# Patient Record
Sex: Female | Born: 1935
Health system: Southern US, Community
[De-identification: ages and names within clinical notes are randomized; demographics above are authoritative.]

## PROBLEM LIST (undated history)

## (undated) DIAGNOSIS — E039 Hypothyroidism, unspecified: Secondary | ICD-10-CM

## (undated) DIAGNOSIS — I2510 Atherosclerotic heart disease of native coronary artery without angina pectoris: Secondary | ICD-10-CM

## (undated) DIAGNOSIS — R519 Headache, unspecified: Secondary | ICD-10-CM

## (undated) DIAGNOSIS — H532 Diplopia: Secondary | ICD-10-CM

## (undated) DIAGNOSIS — R51 Headache: Secondary | ICD-10-CM

## (undated) DIAGNOSIS — R413 Other amnesia: Secondary | ICD-10-CM

## (undated) DIAGNOSIS — G43909 Migraine, unspecified, not intractable, without status migrainosus: Secondary | ICD-10-CM

## (undated) DIAGNOSIS — F329 Major depressive disorder, single episode, unspecified: Secondary | ICD-10-CM

## (undated) DIAGNOSIS — F32A Depression, unspecified: Secondary | ICD-10-CM

## (undated) DIAGNOSIS — Z9289 Personal history of other medical treatment: Secondary | ICD-10-CM

## (undated) DIAGNOSIS — M1990 Unspecified osteoarthritis, unspecified site: Secondary | ICD-10-CM

## (undated) DIAGNOSIS — T4145XA Adverse effect of unspecified anesthetic, initial encounter: Secondary | ICD-10-CM

## (undated) DIAGNOSIS — N289 Disorder of kidney and ureter, unspecified: Secondary | ICD-10-CM

## (undated) DIAGNOSIS — I10 Essential (primary) hypertension: Secondary | ICD-10-CM

## (undated) DIAGNOSIS — I722 Aneurysm of renal artery: Secondary | ICD-10-CM

## (undated) DIAGNOSIS — R269 Unspecified abnormalities of gait and mobility: Secondary | ICD-10-CM

## (undated) DIAGNOSIS — K219 Gastro-esophageal reflux disease without esophagitis: Secondary | ICD-10-CM

## (undated) DIAGNOSIS — G629 Polyneuropathy, unspecified: Secondary | ICD-10-CM

## (undated) DIAGNOSIS — I219 Acute myocardial infarction, unspecified: Secondary | ICD-10-CM

## (undated) DIAGNOSIS — K668 Other specified disorders of peritoneum: Secondary | ICD-10-CM

## (undated) DIAGNOSIS — F411 Generalized anxiety disorder: Secondary | ICD-10-CM

## (undated) HISTORY — DX: Migraine, unspecified, not intractable, without status migrainosus: G43.909

## (undated) HISTORY — PX: CHOLECYSTECTOMY: SHX55

## (undated) HISTORY — DX: Other specified disorders of peritoneum: K66.8

## (undated) HISTORY — PX: BACK SURGERY: SHX140

## (undated) HISTORY — DX: Diplopia: H53.2

## (undated) HISTORY — PX: ABDOMINAL HYSTERECTOMY: SHX81

## (undated) HISTORY — DX: Unspecified abnormalities of gait and mobility: R26.9

## (undated) HISTORY — DX: Disorder of kidney and ureter, unspecified: N28.9

## (undated) HISTORY — PX: TONSILLECTOMY: SUR1361

## (undated) HISTORY — DX: Acute myocardial infarction, unspecified: I21.9

## (undated) HISTORY — PX: COLON SURGERY: SHX602

## (undated) HISTORY — PX: CARDIAC CATHETERIZATION: SHX172

## (undated) HISTORY — PX: APPENDECTOMY: SHX54

## (undated) HISTORY — DX: Other amnesia: R41.3

## (undated) HISTORY — PX: HAND SURGERY: SHX662

## (undated) HISTORY — DX: Generalized anxiety disorder: F41.1

## (undated) HISTORY — DX: Aneurysm of renal artery: I72.2

## (undated) HISTORY — DX: Hypercalcemia: E83.52

---

## 1958-10-04 DIAGNOSIS — I219 Acute myocardial infarction, unspecified: Secondary | ICD-10-CM

## 1958-10-04 HISTORY — DX: Acute myocardial infarction, unspecified: I21.9

## 1978-10-04 DIAGNOSIS — T8859XA Other complications of anesthesia, initial encounter: Secondary | ICD-10-CM

## 1978-10-04 HISTORY — DX: Other complications of anesthesia, initial encounter: T88.59XA

## 1999-04-17 ENCOUNTER — Ambulatory Visit (HOSPITAL_COMMUNITY): Admission: RE | Admit: 1999-04-17 | Discharge: 1999-04-17 | Payer: Self-pay | Admitting: Family Medicine

## 1999-04-17 ENCOUNTER — Encounter: Payer: Self-pay | Admitting: Family Medicine

## 1999-07-14 ENCOUNTER — Ambulatory Visit (HOSPITAL_COMMUNITY): Admission: RE | Admit: 1999-07-14 | Discharge: 1999-07-14 | Payer: Self-pay | Admitting: Orthopaedic Surgery

## 1999-07-28 ENCOUNTER — Ambulatory Visit (HOSPITAL_COMMUNITY): Admission: RE | Admit: 1999-07-28 | Discharge: 1999-07-28 | Payer: Self-pay | Admitting: Orthopaedic Surgery

## 1999-09-11 ENCOUNTER — Ambulatory Visit (HOSPITAL_COMMUNITY): Admission: RE | Admit: 1999-09-11 | Discharge: 1999-09-11 | Payer: Self-pay | Admitting: *Deleted

## 2001-02-07 ENCOUNTER — Inpatient Hospital Stay (HOSPITAL_COMMUNITY): Admission: RE | Admit: 2001-02-07 | Discharge: 2001-02-08 | Payer: Self-pay | Admitting: Orthopaedic Surgery

## 2007-01-25 ENCOUNTER — Ambulatory Visit: Payer: Self-pay | Admitting: Internal Medicine

## 2007-01-25 LAB — CONVERTED CEMR LAB: Creatinine, Ser: 0.9 mg/dL (ref 0.4–1.2)

## 2007-01-28 ENCOUNTER — Encounter: Admission: RE | Admit: 2007-01-28 | Discharge: 2007-01-28 | Payer: Self-pay | Admitting: Internal Medicine

## 2007-02-02 DIAGNOSIS — M1990 Unspecified osteoarthritis, unspecified site: Secondary | ICD-10-CM | POA: Insufficient documentation

## 2007-02-02 DIAGNOSIS — I10 Essential (primary) hypertension: Secondary | ICD-10-CM | POA: Insufficient documentation

## 2007-02-02 DIAGNOSIS — K5730 Diverticulosis of large intestine without perforation or abscess without bleeding: Secondary | ICD-10-CM | POA: Insufficient documentation

## 2007-02-02 DIAGNOSIS — K668 Other specified disorders of peritoneum: Secondary | ICD-10-CM | POA: Insufficient documentation

## 2007-02-02 DIAGNOSIS — F411 Generalized anxiety disorder: Secondary | ICD-10-CM | POA: Insufficient documentation

## 2007-02-02 DIAGNOSIS — F329 Major depressive disorder, single episode, unspecified: Secondary | ICD-10-CM

## 2007-02-02 DIAGNOSIS — K5909 Other constipation: Secondary | ICD-10-CM | POA: Insufficient documentation

## 2007-02-02 DIAGNOSIS — R51 Headache: Secondary | ICD-10-CM

## 2007-02-02 DIAGNOSIS — K559 Vascular disorder of intestine, unspecified: Secondary | ICD-10-CM | POA: Insufficient documentation

## 2007-02-28 ENCOUNTER — Ambulatory Visit (HOSPITAL_COMMUNITY): Admission: RE | Admit: 2007-02-28 | Discharge: 2007-02-28 | Payer: Self-pay | Admitting: Internal Medicine

## 2007-03-15 ENCOUNTER — Ambulatory Visit: Payer: Self-pay | Admitting: Internal Medicine

## 2007-05-25 ENCOUNTER — Encounter: Admission: RE | Admit: 2007-05-25 | Discharge: 2007-05-25 | Payer: Self-pay | Admitting: Cardiothoracic Surgery

## 2007-05-26 ENCOUNTER — Ambulatory Visit: Payer: Self-pay | Admitting: Cardiothoracic Surgery

## 2007-06-30 ENCOUNTER — Encounter: Payer: Self-pay | Admitting: Internal Medicine

## 2010-06-17 NOTE — Assessment & Plan Note (Signed)
[facility]                         GASTROENTEROLOGY OFFICE NOTE   CESIAH, WESTLEY                       MRN:          [id number]  DATE:01/25/2007                            DOB:          [birth year]    REFERRING PHYSICIAN:  Ramond Craver, DO   REASON FOR CONSULTATION:  Second opinion regarding recurrent  pneumoperitoneum.   ASSESSMENT:  This is a 77 year old white woman that had right-sided  ischemic colitis treated with surgical resection in August of 2008.  Subsequently she has had 3 episodes of pneumoperitoneum.  Etiology has  been unclear.  She presents with abdominal pain and infrascapular pain,  and has been found to have pneumoperitoneum.  She has an exploratory  laparotomy in October that was unrevealing.   The etiology of this is not clear to me.  I need to obtain other  records.  Based upon the records I have, there does not seem to be any  small bowel diverticulosis, nor is there evidence of other  diverticulosis in the colon.  Question if she is having some recurrent  ischemia causing this.  She is tender in the right lower quadrant today,  whether that is just merely a postoperative phenomenon or something else  is not clear to me at this time.   PLAN:  1. Obtain records from [facility] admission earlier this year.  2. Obtain records of previous colonoscopy last year, performed in      [place].  3. Perform CT of the abdomen and pelvis with IV contrast.  This will      give [place] some look at the large vessels that supply the gut as well      as the other intra-abdominal and pelvic organ, which could be      related to a problem.   HISTORY:  As above, this 77 year old white woman was in her usual state  of health until August of this year when she presented to the hospital  in [place] and was found to have ischemic colitis of the right colon.  She underwent a right hemicolectomy.  That was an acute onset.  She was  then  transferred to [facility], where she recuperated.  She tells me  she had an upper GI endoscopy there.  I do not have a lot of details  otherwise on that.  She recovered.  Subsequently she developed recurrent  pneumoperitoneum in September, October, and November.  In October she  had exploratory laparotomy by Dr. Hettie Holstein. Her bowel was run.  There  was some lysis of adhesion, adhesions, but there was no evidence of a  perforated viscus.  In between she has felt well, though she has had  some problems with abdominal pain but nothing like the severe pain in  the abdomen and in the shoulders.  GI history notable for some  distension, chronic constipation.  The colonoscopy a year ago was  reportedly normal, or at least she does not really know the results of  that, but said she was told to have one in a year.  X-rays when she was  admitted in October show ileus versus partial small bowel obstruction.  She has had some mild right upper quadrant and lower quadrant dull  aching pain for the past 2-3 days, but says that is not like what  happens when she starts with the spontaneous pneumoperitoneum where  there seems to be a relatively sudden onset of pain.  She has tolerated  these episodes remarkably well.  Laboratory studies have been  unrevealing as well.   PAST MEDICAL HISTORY:  1. Hypertension.  2. Depression.  3. The ischemic colitis status post resection.  4. Total abdominal hysterectomy.  5. Cholecystectomy.  6. Appendectomy.  7. Prior Nissen fundoplication in 1998 by Glenna Fellows.  8. Exploratory laparotomy in October as described.  9. Chronic headaches.  10.Osteoarthritis.  11.Anxiety.   MEDICATIONS:  1. Lisinopril 10 mg 1-1/2 tabs daily.  2. Hydrochlorothiazide 12.5 mg every morning.  3. Black cohosh root2 each day.  4. Aspirin 81 mg daily.  5. Omeprazole 20 mg every evening.  6. Metoprolol 50 mg daily.  7. Trazodone 50 mg at bedtime.   ALLERGIES:  No known  allergies.   ADDITIONAL HISTORY:  This dull, aching pain in the right side occurs.  She has had more severe pain when she had the episode of  pneumoperitoneum and it was worse when lay on her right side.  Her bowel  habits are otherwise unchanged.  GI review of systems is otherwise  negative.   FAMILY HISTORY:  Breast cancer in the sister, diabetes in the father,  heart disease in her parents, she believes.  Crohn's disease in a few  second cousins.   SOCIAL HISTORY:  She is married.  She is retired from the [employer] system.  She lives with her husband.  She has 1 son, 2 daughters.  No alcohol, tobacco, or drugs.   REVIEW OF SYSTEMS:  She said she was febrile when she was hospitalized.  She had some insomnia.  She has had some fatigue problems after the  initial bout of ischemic colitis.  She wears eyeglasses.  All other  systems are negative.   PHYSICAL EXAMINATION:  Reveals a relatively robust-appearing elderly,  white woman.  Height 5 feet 7 inches.  Weight 180.8 pounds.  Blood  pressure 104/64.  Pulse 68 and regular.  EYES:  Anicteric.  ENT:  Shows normal mouth, posterior pharynx.  NECK:  Supple. No thyromegaly.  CHEST:  Clear.  HEART:  S1, S2.  No rubs, murmurs, or gallops.  ABDOMEN:  Obese, soft.  She is mildly tender in the right lower  quadrant.  There are surgical scars present.  There is no hernia.  LYMPHATIC:  No neck or supraclavicular adenopathy or inguinal adenopathy  detected.  EXTREMITIES:  Free of edema in the lower extremity.  SKIN:  Warm and dry, without acute rash.  NEUROLOGIC:  Grossly intact.  PSYCHIATRIC:  She is alert and oriented x3.   DATA REVIEWED:  History and physical, discharge summaries, operative  notes from [facility] from August, October, and November of 2008.  Pathology report October 01, 2006 showing ascending colon segment, and  distal ileum consistent with ischemic colitis.  Abdominal films noted.  These are from  October, showing the ileus patterns.   Will request the [facility] records as well as the records for the colonoscopy  as described above.   I appreciate the opportunity to care for this patient.  This is a  complicated situation.  She may need further studies  other than that  mentioned above, but would start with the CT scan.  Further plans  pending the above.   I appreciate the opportunity to care for this patient.     Iva Boop, MD,FACG  Electronically Signed    CEG/MedQ  DD: 01/25/2007  DT: 01/26/2007  Job #: [id number]   cc:   Ramond Craver, DO

## 2010-06-17 NOTE — Consult Note (Signed)
NEW PATIENT CONSULTATION   Laurie Atkinson, Laurie Atkinson  DOB:  1933/10/24                                        May 26, 2007  CHART #:  [id number]   PRIMARY CARE PHYSICIAN:  Dr. Lois Huxley, [place], [place].   REASON FOR CONSULTATION:  Third opinion consultation for recurrent  pneumoperitoneum after surgical resection of ischemic colitis.   HISTORY OF PRESENT ILLNESS:  The patient is a 77 year old female with a  complicated medical history with various procedures done in multiple  different institutions in the past.  In September 04, 2006, the patient  developed acute abdominal pain, underwent colon resection at [facility] by Dr. Marvetta Gibbons and then was transferred to [facility] for  postoperative care.  She again then represented in October 2008 with  pneumoperitoneum, underwent exploratory laparotomy in [place], [place]  [place], with no source of perforation found and was discharged home.  She presented again in November 2008 with recurrent pneumoperitoneum and  was not reoperated on, but observed.  Subsequently, she was referred to  Dr. Leone Payor in [place] office.  The patient tells me today she is here  to have her lungs checked.Marland Kitchen   PAST MEDICAL HISTORY:  Significant for  1. Hypertension.  2. Ischemic bowels noted above August 2008  3. History of depression.   PAST SURGICAL HISTORY:  1. Exploratory laparotomy November 28, 2006.  2. Exploratory laparotomy with hemicolectomy August 2008 for ischemic      bowel.  3. Total abdominal hysterectomy.  4. Cholecystectomy.  5. Appendectomy.  6. Laparoscopic Nissen fundoplication in 1998 by Dr. Johna Sheriff in      [place], [place], for reflux esophagitis.   SOCIAL HISTORY:  The patient is a nonsmoker and nondrinker.  Currently  retired, lives with her husband.   FAMILY HISTORY:  Significant for breast cancer in his sister myocardial  infarction in her mother, hypertension and stroke in her  father.   REVIEW OF SYSTEMS:  Other than noted above, patient has had no change in  bowel habits.  No blood in her stool.  She does get bloating after  eating which has been chronic.  She has had no hemoptysis.  No  hematuria.  Has no anginal chest pain.  Other review of systems are  negative.   PHYSICAL EXAMINATION:  VITAL SIGNS:  Her blood pressure is 180/79, pulse  is 61, respiratory rate 18, O2 sats 97%.  NECK:  The patient has no carotid bruits.  I do not appreciate any neck  masses.  LUNGS:  Clear bilaterally.  CARDIAC:  Exam reveals regular rate and rhythm without murmur or gallop.  ABDOMEN:  Exam is benign.  She does have a healed midline abdominal  incision consistent with her history of resection. LOWER EXTREMITIES:  Without pedal edema or tenderness.   Prior to my seeing the patient, Dr. Marvetta Gibbons had discussed the case  with my partner, Dr. Cornelius Moras, who had recommended barium swallow and CT  scan.  The barium swallow is reviewed and x-ray interpretations show  moderate suspicion for a mass in the oral pharynx near the vallecula.  Direct visualization was recommended.  Status post hiatal hernia repair.  No evidence of reflux or stricture.  The CT scan showed no evidence of  pneumothorax or pneumoperitoneum, mild cardiomegaly, shotty mediastinal  lymph nodes presumably  reactive were noted.   SUGGESTIONS:  Currently, there is no obvious source of pneumoperitoneum  with a nonsmoker with no lung lesions that would be suspicious for  causing a pneumothorax/pneumoperitoneum.  It is unlikely the chest is  the source of her recurrent pneumoperitoneum.  It is concerning  that  radiographic findings of the larynx and, although this may be an over-  read by radiology, I have discussed the case with the patient's primary  care doctor, Dr. Katrinka Blazing in [place], [facility], who arranged for  an ENT evaluation locally.  Since the patient has been operated on by  Dr.  Johna Sheriff, I have  asked her to make an appointment with him, not for any  specific treatment this time, but should she have recurrent  pneumoperitoneum, he will have previously evaluated her.   Sheliah Plane, MD  Electronically Signed   EG/MEDQ  D:  05/26/2007  T:  05/26/2007  Job:  [id number]   cc:   Lois Huxley, M.D.  Salvatore Decent. Cornelius Moras, M.D.  Lorne Skeens. Hoxworth, M.D.

## 2010-06-17 NOTE — Assessment & Plan Note (Signed)
[facility]                         GASTROENTEROLOGY OFFICE NOTE   JAWANNA, DYKMAN                       MRN:          [id number]  DATE:03/15/2007                            DOB:          1933-06-24    Ms. Baltazar returns.  I had seen her through a second-opinion consultation  from Dr. Hettie Holstein in [facility].  Please see my note of January 25, 2007.  Briefly, she had three episodes of pneumoperitoneum after an  initial surgical resection for right-sided ischemic colitis.  Since  December, she has done well, except for some mild aching pain in the  right abdominal area.  Workup I did included review of the records from  [facility].  She had some diffuse calcifications on a CT angio, but no  focal artery stenosis.  She had a CT of the abdomen and pelvis here, in  [place], which showed postoperative changes, hypodensity in the  spleen, renal hypodensities, likely cysts.  She had some end plate  compression of T12 of indeterminate age, sigmoid diverticulosis and a 3  cm cystic structure in the region of the uterus.  An ultrasound was  performed and showed this to be fluid-filled and have benign features  and indeed she has had a total abdominal hysterectomy and bilateral  salpingo-oophorectomy, so it is interpreted to be a postoperative fluid  collection.   I explained all of this to her today.   Her medications are listed and reviewed in the chart.   She had a colonoscopy in 2006 by Dr. Volney Presser and it did not show any  polyps at the time.  She had hemorrhoids and diverticulosis.  So she  should not need a colonoscopy before five years.  (She says there is a  history of polyps.)   I will plan to see her back as needed at this point.  I do not know why  she had the recurrent pneumoperitoneum.  I cannot see any other workup  at this time, based on what I know here.  She will follow up as needed,  but she plans to get her regular followup in  [facility] [address]     Iva Boop, MD,FACG  Electronically Signed    CEG/MedQ  DD: 03/15/2007  DT: 03/16/2007  Job #: [id number]   cc:   Ramond Craver, DO, [place]

## 2010-06-20 NOTE — Op Note (Signed)
[place]  Patient:    Laurie Atkinson, Laurie Atkinson Visit Number: [id number] MRN: [id number]          Service Type: SUR Location: [facility] 2550 06 Attending Physician:  Jacki Cones Dictated by:   Veverly Fells Ophelia Charter, M.D. Proc. Date: 02/07/01 Admit Date:  02/07/2001                             Operative Report  PREOPERATIVE DIAGNOSIS:  L4-5 foraminal stenosis.  POSTOPERATIVE DIAGNOSIS:  L4-5 foraminal stenosis.  PROCEDURE:  L4-5 decompression with bilateral foraminotomies.  SURGEON:  Mark C. Ophelia Charter, M.D.  ASSISTANT:  Zonia Kief, P.A.-C.  ESTIMATED BLOOD LOSS:  100 ml.  COMPLICATIONS:  None.  DESCRIPTION OF PROCEDURE:  After induction of general anesthesia, patient placed in the Holcomb frame with careful padding and positioning. Preoperative Ancef was given.  The back was prepped with Duraprep.  The area was square with towels, a Betadine Vi-Drape applied, a laminectomy sheet applied.  A midline incision was made after needle localization with two needles showed that the inferior needle was at L4-5 and the superior needle was adjacent to the L3-4 space.  Incision was made starting at the inferior needle and extending distally for 4 cm and 1 cm proximal.  Subperiosteal dissection was performed on the L4 lamina, and self-retaining retractor was placed.  A laminotomy was performed after thinning the lamina with the Leksell bilaterally.  There was hypertrophic ligament, which was removed.  Once the dura was visualized, operative microscope was brought in and the remaining ligament was removed.  Bone was removed out to the L4 pedicle.  The foramina were tight, and the top portion of the L5 lamina was removed, enlarging the foramina and taking a portion of the superior and inferior facets that was causing compression on the nerve roots.  Both nerve roots were directly visualized with the axilla of the nerve root and the first centimeter of the nerve root  completely visualized.  There was epidural fat in both axillae, and once bone had been removed out to the pedicle, the lateral wall was debrided in line with the pedicle superiorly.  Then a portion of the lamina was left up at the top, since it was not an area of central compression.  There was direct visualization of the dura, and the disk was checked from both sides as well as palpated in the midline.  The disk was hard and although previous myelo-CT had shown some questionable disk material, this was due to some end plate spurring in the hard disk, and there was no extruded material.  After irrigation with saline solution, nerve roots were checked again and palpated cephalad, caudad, anterior, and dorsal to the nerve root, and there was complete freedom.  After irrigation with saline solution, fascia was closed with 0 Vicryl, subcutaneous tissue with 2-0 Vicryl, and Marcaine infiltration of the skin and skin staple closure.  Postop, the patient had good relief of her leg pain and was able to lie on her right side for the first time in a year without recurrence of her leg pain from nerve root compression. Dictated by:   Veverly Fells Ophelia Charter, M.D. Attending Physician:  Jacki Cones DD:  02/07/01 TD:  02/07/01 Job: [id number]

## 2012-02-03 DIAGNOSIS — Z9289 Personal history of other medical treatment: Secondary | ICD-10-CM

## 2012-02-03 HISTORY — DX: Personal history of other medical treatment: Z92.89

## 2012-04-02 HISTORY — PX: HERNIA REPAIR: SHX51

## 2013-02-22 ENCOUNTER — Encounter: Payer: Self-pay | Admitting: Surgery

## 2013-03-07 ENCOUNTER — Ambulatory Visit: Payer: 59 | Admitting: Endocrinology

## 2013-03-16 ENCOUNTER — Other Ambulatory Visit (INDEPENDENT_AMBULATORY_CARE_PROVIDER_SITE_OTHER): Payer: 59

## 2013-03-16 ENCOUNTER — Encounter: Payer: Self-pay | Admitting: Endocrinology

## 2013-03-16 ENCOUNTER — Ambulatory Visit (INDEPENDENT_AMBULATORY_CARE_PROVIDER_SITE_OTHER): Payer: 59 | Admitting: Endocrinology

## 2013-03-16 DIAGNOSIS — I10 Essential (primary) hypertension: Secondary | ICD-10-CM

## 2013-03-16 LAB — BASIC METABOLIC PANEL
BUN: 19 mg/dL (ref 6–23)
CALCIUM: 10.5 mg/dL (ref 8.4–10.5)
CO2: 28 meq/L (ref 19–32)
CREATININE: 1.4 mg/dL — AB (ref 0.4–1.2)
Chloride: 97 mEq/L (ref 96–112)
GFR: 40.37 mL/min — ABNORMAL LOW (ref >60.00)
Glucose, Bld: 88 mg/dL (ref 70–99)
Potassium: 3.7 mEq/L (ref 3.5–5.1)
Sodium: 133 mEq/L — ABNORMAL LOW (ref 135–145)

## 2013-03-16 NOTE — Progress Notes (Addendum)
Patient ID: Laurie Atkinson, female   DOB: [birth year], 80 y.o.   MRN: [id number]   Chief complaint: High calcium  History of Present Illness:   Review of records show that she has had a high calcium since 01/2013 but previously records are not available  The hypercalcemia is not associated with any pathologic fractures, sarcoidosis, known carcinoma, thyroid disease She does have mild renal insufficiency. She thinks she has had a kidney stone seen on x-ray 4-5 years ago but has not passed a stone or had any acute pain She has not had any height loss or known vertebral fractures   Calcium levels in the last 2 months have been ranging from 10.2-11.6 the last level being on 02/20/13 Parathyroid hormone level not available and no recent  25 (OH) Vitamin D level  available She has been on vitamin D supplements for about 2 years, taking 50,000 units weekly, presumably for vitamin D deficiency     Medication List       This list is accurate as of: 03/16/13  2:37 PM.  Always use your most recent med list.               diazepam 5 MG tablet  Commonly known as:  VALIUM  Take 5 mg by mouth daily as needed for anxiety.     FLUZONE HIGH-DOSE injection  Generic drug:  influenza vac split trivalent high-dose     gabapentin 300 MG capsule  Commonly known as:  NEURONTIN     HYDROcodone-acetaminophen 5-325 MG per tablet  Commonly known as:  NORCO/VICODIN  Take 1 tablet by mouth every 6 (six) hours as needed for moderate pain.     lisinopril-hydrochlorothiazide 20-12.5 MG per tablet  Commonly known as:  PRINZIDE,ZESTORETIC  Takes 2 tablets in am     metoprolol succinate 50 MG 24 hr tablet  Commonly known as:  TOPROL-XL     pantoprazole 40 MG tablet  Commonly known as:  PROTONIX     PROBIOTIC DAILY PO  Take by mouth.     Vitamin D (Ergocalciferol) 50000 UNITS Caps capsule  Commonly known as:  DRISDOL        Allergies:  Allergies  Allergen Reactions  . Morphine And Related Hives   . Oxycodone Itching  . Ambien [Zolpidem] Anxiety    Past Medical History  Diagnosis Date  . Aneurysm of renal artery   . Unspecified disorder of kidney and ureter   . Hypercalcemia   . Abnormality of gait   . Diplopia   . Other specified disorder of peritoneum   . Anxiety state, unspecified     No past surgical history on file.  No family history on file.  Social History:  reports that she has never smoked. She has never used smokeless tobacco. Her alcohol and drug histories are not on file.    REVIEW of SYSTEMS  She did lose weight after her surgery last year but not recently  Recently has had some decreased appetite but this is variable  She complains of feeling weak and tired for some time  Htn years, 1/15 takes 2   Has history of anemia  Loose stools at times but no abdominal pain or nausea  No recent swelling of her feet  No history of recent low back pain or bone/ joint pains  EXAM:  BP 128/64  Pulse 62  Temp(Src) 98.3 F (36.8 C)  Resp 16  Ht 5\' 7"  (1.702 m)  Wt 183 lb 3.2 oz (  83.099 kg)  BMI 28.69 kg/m2  SpO2 95%  GENERAL: Averagely built, heavy set  No pallor, clubbing, lymphadenopathy in the neck or peripheral edema.   Skin:  no rash or pigmentation.  EYES:  Externally normal.  Fundii:  normal discs and vessels.  ENT: Oral mucosa and tongue normal.  THYROID:  Not palpable. No other mass in the neck  HEART:  Normal  S1 and S2; no murmur or click.  CHEST:  Normal shape Lungs:   Vescicular breath sounds heard equally.  No crepitations/ wheeze.  ABDOMEN:  No distention.  Liver and spleen not palpable.  No other mass there is some tenderness in the right lower quadrant present  NEUROLOGICAL: .Reflexes are normal bilaterally at biceps and 1+ at the ankles  SPINE AND JOINTS:  Normal.  Assessment/Plan:   HYPERCALCEMIA: Etiology is unclear but most likely related to either primary or secondary hyperparathyroidism. She is also on 25 mg  HCTZ which would potentially exacerbate the hypercalcemia Currently she appears asymptomatic and has had no history of known osteoporosis, fragility fractures or symptomatic nephrolithiasis Discussed with the patient at if she does have hyperparathyroidism that she is not a candidate for surgery because of not meeting the criteria and the calcium level being less than 11.5  Plan:  She will be evaluated further with parathyroid hormone levels, vitamin D levels, serum protein electrophoresis.  Renal function and calcium will be rechecked since her HCTZ and lisinopril were increased last month  Would recommend that she get a bone density for baseline evaluation to her PCP  Also would recommend that she be treated with a drug other than HCTZ for hypertension  Need periodic monitoring if she only has hyperparathyroidism  Consider adjusting vitamin D dosage based on vitamin D levels   Laurie Atkinson 03/16/2013, 2:37 PM   Addendum:   Vitamin D 40.8,  normal, repeat calcium 10.5, needs PTH drawn as plasma not drawn at the lab

## 2013-03-17 LAB — PARATHYROID HORMONE, INTACT (NO CA)

## 2013-03-17 LAB — VITAMIN D 25 HYDROXY (VIT D DEFICIENCY, FRACTURES): Vit D, 25-Hydroxy: 40.8 ng/mL (ref 30.0–100.0)

## 2013-03-27 ENCOUNTER — Encounter: Payer: Self-pay | Admitting: Surgery

## 2013-03-31 ENCOUNTER — Encounter: Payer: Self-pay | Admitting: Surgery

## 2013-04-02 DIAGNOSIS — I722 Aneurysm of renal artery: Secondary | ICD-10-CM

## 2013-04-02 HISTORY — DX: Aneurysm of renal artery: I72.2

## 2013-04-03 ENCOUNTER — Encounter: Payer: Self-pay | Admitting: Surgery

## 2013-04-03 ENCOUNTER — Ambulatory Visit (INDEPENDENT_AMBULATORY_CARE_PROVIDER_SITE_OTHER): Payer: 59 | Admitting: Surgery

## 2013-04-03 VITALS — BP 132/59 | HR 58 | Resp 16 | Ht 67.0 in | Wt 189.0 lb

## 2013-04-03 DIAGNOSIS — I722 Aneurysm of renal artery: Secondary | ICD-10-CM

## 2013-04-03 NOTE — Progress Notes (Signed)
Patient name: Laurie NakayamaBillie F Atkinson MRN: [id number] DOB: September 08, 1933 Sex: female   Referred by: Dr. Earlene Plateravis  Reason for referral:  Chief Complaint  Patient presents with  . New Evaluation    Right renal artery aneurysm, Ref. by Dr. Lois HuxleyJames Mullinix  C/O Pain in right lower back to abdomin, duration 5 mo.    HISTORY OF PRESENT ILLNESS: This is a very pleasant 80 year old female who was referred today for evaluation of a right renal artery aneurysm.  She states that she just found out about this in the center.  She comes with imaging studies [facility] does show a 1.5 cm right renal artery aneurysm on a February 2014 scan.  She had a CAT scan and [place] which showed a stable aneurysm.  The patient denies having abdominal discomfort.  She does have a history of free air in the past.  She has had multiple episodes of free air and undergone exploration once which was negative.  She is also had a cholecystectomy and appendectomy.  The patient suffers with hypertension which is controlled medically.  She has hypercholesterolemia which is diet controlled.  She has been told she had a silent heart attack in the past.  Past Medical History  Diagnosis Date  . Aneurysm of renal artery   . Unspecified disorder of kidney and ureter   . Hypercalcemia   . Abnormality of gait   . Diplopia   . Other specified disorder of peritoneum   . Anxiety state, unspecified   . Renal artery aneurysm March 2015  . Myocardial infarction     ? when    Past Surgical History  Procedure Laterality Date  . Hernia repair  04/2012    Umbilical  . Abdominal hysterectomy    . Appendectomy    . Colon surgery      History   Social History  . Marital Status: Married    Spouse Name: N/A    Number of Children: N/A  . Years of Education: N/A   Occupational History  . Not on file.   Social History Main Topics  . Smoking status: Never Smoker   . Smokeless tobacco: Never Used  . Alcohol Use: No    . Drug Use: No  . Sexual Activity: Not on file   Other Topics Concern  . Not on file   Social History Narrative  . No narrative on file    Family History  Problem Relation Age of Onset  . Heart disease Mother     Heart Disease before age 78  . Stroke Mother     [id number]  . Cancer Father     Throat  . Diabetes Father   . Cancer Sister   . Heart disease Brother     Pacemaker    Allergies as of 04/03/2013 - Review Complete 04/03/2013  Allergen Reaction Noted  . Morphine and related Hives 03/16/2013  . Oxycodone Itching 03/16/2013  . Ambien [zolpidem] Anxiety 03/16/2013    Current Outpatient Prescriptions on File Prior to Visit  Medication Sig Dispense Refill  . diazepam (VALIUM) 5 MG tablet Take 5 mg by mouth daily as needed for anxiety.      Marland Kitchen. FLUZONE HIGH-DOSE injection       . gabapentin (NEURONTIN) 300 MG capsule       . HYDROcodone-acetaminophen (NORCO/VICODIN) 5-325 MG per tablet Take 1 tablet by mouth every 6 (six) hours as needed for moderate pain.      .Marland Kitchen  lisinopril-hydrochlorothiazide (PRINZIDE,ZESTORETIC) 20-12.5 MG per tablet Takes 2 tablets in am      . metoprolol succinate (TOPROL-XL) 50 MG 24 hr tablet       . pantoprazole (PROTONIX) 40 MG tablet       . Probiotic Product (PROBIOTIC DAILY PO) Take by mouth.      . Vitamin D, Ergocalciferol, (DRISDOL) 50000 UNITS CAPS capsule        No current facility-administered medications on file prior to visit.     REVIEW OF SYSTEMS: Cardiovascular: No chest pain, chest pressure, palpitations, orthopnea, or dyspnea on exertion. No claudication or rest pain.  Positive for swelling in her legs and varicose veins Pulmonary: No productive cough, asthma or wheezing. Neurologic: No weakness, paresthesias, aphasia, or amaurosis. No dizziness. Hematologic: No bleeding problems or clotting disorders. Musculoskeletal: No joint pain or joint swelling. Gastrointestinal: No blood in stool or hematemesis Genitourinary: Positive  for burning with urination Psychiatric:: No history of major depression. Integumentary: No rashes or ulcers. Constitutional: No fever or chills.  PHYSICAL EXAMINATION: General: The patient appears their stated age.  Vital signs are BP 132/59  Pulse 58  Resp 16  Ht 5\' 7"  (1.702 m)  Wt 189 lb (85.73 kg)  BMI 29.59 kg/m2  SpO2 100% HEENT:  No gross abnormalities Pulmonary: Respirations are non-labored Abdomen: Soft and non-tender.  No abdominal bruits.  Musculoskeletal: There are no major deformities.   Neurologic: No focal weakness or paresthesias are detected, Skin: There are no ulcer or rashes noted. Psychiatric: The patient has normal affect. Cardiovascular: There is a regular rate and rhythm without significant murmur appreciated.  Palpable dorsalis pedis pulse bilaterally.  No carotid bruits.  Diagnostic Studies: I have reviewed the images I have available to me.  I have seen a CAT scan from February 2014 which shows a 1.5 cm aneurysm.  I have also reviewed a CAT scan from 2012 which shows right renal artery ectasia but aneurysm is not well visualized.  I cannot do her most recent study from [place]    Assessment:  Right renal artery aneurysm Plan: I discussed with the patient and her family that from what I can determine this aneurysm has been relatively stable over time.  Maximum diameter is 1.5 cm.  At this diameter I would not recommend repair as I think she is at very low risk for rupture.  All the CAT scans I have reviewed are not timed to evaluate the arteries, so there is venous artifact.  In addition they are 5 mm cut was does not give me the detail I like to see.  Therefore, I have recommended that the patient come back for a repeat evaluation in 6 months with a formal CT angiogram of the abdomen and pelvis.   Jorge Ny, M.D. [facility] Office: [phone] Pager:  [phone]

## 2013-04-04 NOTE — Addendum Note (Signed)
Addended by: Sharee PimpleMCCHESNEY, MARILYN K on: 04/04/2013 08:07 AM   Modules accepted: Orders

## 2013-05-15 ENCOUNTER — Encounter: Payer: Self-pay | Admitting: Endocrinology

## 2013-05-15 ENCOUNTER — Other Ambulatory Visit (INDEPENDENT_AMBULATORY_CARE_PROVIDER_SITE_OTHER): Payer: 59

## 2013-05-15 ENCOUNTER — Ambulatory Visit (INDEPENDENT_AMBULATORY_CARE_PROVIDER_SITE_OTHER): Payer: 59 | Admitting: Endocrinology

## 2013-05-15 ENCOUNTER — Other Ambulatory Visit: Payer: Self-pay | Admitting: Endocrinology

## 2013-05-15 DIAGNOSIS — N183 Chronic kidney disease, stage 3 unspecified: Secondary | ICD-10-CM

## 2013-05-15 DIAGNOSIS — I10 Essential (primary) hypertension: Secondary | ICD-10-CM

## 2013-05-15 LAB — BASIC METABOLIC PANEL
BUN: 24 mg/dL — AB (ref 6–23)
CALCIUM: 10.4 mg/dL (ref 8.4–10.5)
CO2: 23 mEq/L (ref 19–32)
CREATININE: 1.4 mg/dL — AB (ref 0.4–1.2)
Chloride: 102 mEq/L (ref 96–112)
GFR: 37.46 mL/min — AB (ref >60.00)
GLUCOSE: 76 mg/dL (ref 70–99)
POTASSIUM: 4.6 meq/L (ref 3.5–5.1)
Sodium: 135 mEq/L (ref 135–145)

## 2013-05-15 NOTE — Progress Notes (Signed)
Patient ID: Laurie NakayamaBillie F Farace, female   DOB: 05/13/1933, 80 y.o.   MRN: [id number]   Chief complaint: High calcium  History of Present Illness:   Review of records show that she has had a high calcium since 01/2013 but previously records are not available  The hypercalcemia is not associated with any pathologic fractures, sarcoidosis, known carcinoma, thyroid disease She does have mild renal insufficiency. She thinks she has had a kidney stone seen on x-ray 4-5 years ago but has not passed a stone or had any acute pain She has not had any height loss or known vertebral fractures Currently does not feel nauseated or have any weight loss   Calcium levels in the last 2 months have been ranging from 10.2-11.6   Parathyroid hormone level 42 and normal 25 (OH) Vitamin D level   She has been on vitamin D supplements for about 2 years, taking 50,000 units weekly, presumably for vitamin D deficiency  Lab Results  Component Value Date   CALCIUM 10.4 05/15/2013   CALCIUM 10.5 03/16/2013   Bone density to be done, she missed her appointment Her hydrochlorothiazide has been stopped as it may be playing a role in her hypercalcemia      Medication List       This list is accurate as of: 05/15/13 11:59 PM.  Always use your most recent med list.               diazepam 5 MG tablet  Commonly known as:  VALIUM  Take 5 mg by mouth daily as needed for anxiety.     Fish Oil 1000 MG Caps  Take by mouth daily.     FLUZONE HIGH-DOSE injection  Generic drug:  influenza vac split trivalent high-dose     furosemide 20 MG tablet  Commonly known as:  LASIX  20 mg. Take 1 1/2 tablets daily     gabapentin 300 MG capsule  Commonly known as:  NEURONTIN     HYDROcodone-acetaminophen 5-325 MG per tablet  Commonly known as:  NORCO/VICODIN  Take 1 tablet by mouth every 6 (six) hours as needed for moderate pain.     lisinopril 20 MG tablet  Commonly known as:  PRINIVIL,ZESTRIL  Take 1 1/2 tablet daily      metoprolol succinate 50 MG 24 hr tablet  Commonly known as:  TOPROL-XL     pantoprazole 40 MG tablet  Commonly known as:  PROTONIX     PROBIOTIC DAILY PO  Take by mouth.     RED YEAST RICE PO  Take 500 mg by mouth 2 (two) times daily.     Vitamin D (Ergocalciferol) 50000 UNITS Caps capsule  Commonly known as:  DRISDOL        Allergies:  Allergies  Allergen Reactions  . Morphine And Related Hives  . Oxycodone Itching  . Ambien [Zolpidem] Anxiety    Past Medical History  Diagnosis Date  . Aneurysm of renal artery   . Unspecified disorder of kidney and ureter   . Hypercalcemia   . Abnormality of gait   . Diplopia   . Other specified disorder of peritoneum   . Anxiety state, unspecified   . Renal artery aneurysm March 2015  . Myocardial infarction     ? when    Past Surgical History  Procedure Laterality Date  . Hernia repair  04/2012    Umbilical  . Abdominal hysterectomy    . Appendectomy    . Colon surgery  Family History  Problem Relation Age of Onset  . Heart disease Mother     Heart Disease before age 78  . Stroke Mother     [id number]  . Cancer Father     Throat  . Diabetes Father   . Cancer Sister   . Heart disease Brother     Pacemaker    Social History:  reports that she has never smoked. She has never used smokeless tobacco. She reports that she does not drink alcohol or use illicit drugs.    REVIEW of SYSTEMS   History of hypertension, now treated with lisinopril  Her last creatinine level was 1.4  Has control of edema with Lasix  EXAM:  BP 132/68  Pulse 68  Temp(Src) 98.5 F (36.9 C)  Resp 14  Ht 5\' 7"  (1.702 m)  Wt 192 lb 6.4 oz (87.272 kg)  BMI 30.13 kg/m2  SpO2 94%  Assessment:  HYPERCALCEMIA: This is most likely related to primary hyperparathyroidism with normal vitamin D level, parathyroid hormone level of 42 and  mildly increased levels of calcium. Parathyroid hormone level would be higher if she had secondary  hyperparathyroidism Since her level is only upper normal it is unlikely that it is related to another process like sarcoidosis or malignancy Her hydrochlorothiazide has been stopped as it may be playing a role in her hypercalcemia Bone density is being rescheduled currently  She is not a candidate for surgery because of not meeting the criteria and the calcium level being less than 11.5  Plan:   She will have repeat calcium done today  Recheck PTH level  If her bone density is not severely low would not consider parathyroid surgery, osteopenia can be treated with bisphosphonates   Reather LittlerAjay Nylan Nevel 05/16/2013, 9:35 AM   Addendum:   Calcium 10.4, adequately controlled

## 2013-05-17 LAB — PROTEIN ELECTROPHORESIS, SERUM
A/G RATIO SPE: 1.4 (ref 0.7–2.0)
ALPHA 1: 0.2 g/dL (ref 0.1–0.4)
Albumin ELP: 3.8 g/dL (ref 3.2–5.6)
Alpha 2: 0.8 g/dL (ref 0.4–1.2)
Beta: 0.9 g/dL (ref 0.6–1.3)
GAMMA GLOBULIN: 0.9 g/dL (ref 0.5–1.6)
GLOBULIN, TOTAL: 2.8 g/dL (ref 2.0–4.5)
Total Protein: 6.6 g/dL (ref 6.0–8.5)

## 2013-05-17 LAB — PARATHYROID HORMONE, INTACT (NO CA): PTH: 59 pg/mL (ref 15–65)

## 2013-05-21 NOTE — Progress Notes (Signed)
Quick Note:  Calcium still normal, parathyroid hormone level is not too high, no further action needed ______

## 2013-10-13 ENCOUNTER — Other Ambulatory Visit: Payer: Self-pay | Admitting: *Deleted

## 2013-10-13 ENCOUNTER — Encounter: Payer: Self-pay | Admitting: Surgery

## 2013-10-13 DIAGNOSIS — I739 Peripheral vascular disease, unspecified: Secondary | ICD-10-CM

## 2013-10-13 LAB — BUN: BUN: 17 mg/dL (ref 6–23)

## 2013-10-13 LAB — CREATININE, SERUM: Creat: 1.47 mg/dL — ABNORMAL HIGH (ref 0.50–1.10)

## 2013-10-16 ENCOUNTER — Encounter: Payer: Self-pay | Admitting: Surgery

## 2013-10-16 ENCOUNTER — Ambulatory Visit
Admission: RE | Admit: 2013-10-16 | Discharge: 2013-10-16 | Disposition: A | Discharge status: Home / Self Care | Payer: Medicare Other | Source: Ambulatory Visit | Attending: Surgery | Admitting: Surgery

## 2013-10-16 ENCOUNTER — Ambulatory Visit (INDEPENDENT_AMBULATORY_CARE_PROVIDER_SITE_OTHER): Payer: Medicare Other | Admitting: Surgery

## 2013-10-16 VITALS — BP 171/91 | HR 55 | Resp 16 | Ht 67.0 in | Wt 188.0 lb

## 2013-10-16 DIAGNOSIS — R42 Dizziness and giddiness: Secondary | ICD-10-CM

## 2013-10-16 DIAGNOSIS — I722 Aneurysm of renal artery: Secondary | ICD-10-CM

## 2013-10-16 DIAGNOSIS — R52 Pain, unspecified: Secondary | ICD-10-CM

## 2013-10-16 MED ORDER — IOHEXOL 350 MG/ML SOLN
50.0000 mL | Freq: Once | INTRAVENOUS | Status: AC | PRN
  Administered 2013-10-16: 50 mL via INTRAVENOUS

## 2013-10-16 NOTE — Progress Notes (Signed)
Patient name: Laurie Atkinson MRN: [id number] DOB: 1934-01-29 Sex: female     Chief Complaint  Patient presents with  . Re-evaluation    6  mo   RENAL ARTERY ANEURYSM  f/u with CTA abd/pel.  C/O   Right side/flank aches off/on, duration 2-3 mo. Dizziness, 4 wks.    HISTORY OF PRESENT ILLNESS: The patient is back today for followup.  She was found to have a 1.5 cm right renal artery aneurysm which was detected on a CT scan in 2014.  She had had several scans since then which showed a stable aneurysm.  The images I could review were not timed for the arterial phase, and I did not get a grade evaluation of the aneurysm.  Therefore I sent her for a CT scan.  She reports no significant issues since last saw her.  She has occasional right upper quadrant pain which is dull  Past Medical History  Diagnosis Date  . Aneurysm of renal artery   . Unspecified disorder of kidney and ureter   . Hypercalcemia   . Abnormality of gait   . Diplopia   . Other specified disorder of peritoneum   . Anxiety state, unspecified   . Renal artery aneurysm March 2015  . Myocardial infarction     ? when    Past Surgical History  Procedure Laterality Date  . Hernia repair  04/2012    Umbilical  . Abdominal hysterectomy    . Appendectomy    . Colon surgery      History   Social History  . Marital Status: Married    Spouse Name: N/A    Number of Children: N/A  . Years of Education: N/A   Occupational History  . Not on file.   Social History Main Topics  . Smoking status: Never Smoker   . Smokeless tobacco: Never Used  . Alcohol Use: No  . Drug Use: No  . Sexual Activity: Not on file   Other Topics Concern  . Not on file   Social History Narrative  . No narrative on file    Family History  Problem Relation Age of Onset  . Heart disease Mother     Heart Disease before age 48  . Stroke Mother     47  . Heart attack Mother   . Cancer Father     Throat  . Diabetes Father   .  Heart attack Father   . Cancer Sister     Breast, Brain, Kidney  . Heart disease Brother     Pacemaker- Before age 45  . Heart attack Brother   . Diabetes Sister     Allergies as of 10/16/2013 - Review Complete 10/16/2013  Allergen Reaction Noted  . Morphine and related Hives 03/16/2013  . Oxycodone Itching 03/16/2013  . Ambien [zolpidem] Anxiety 03/16/2013    Current Outpatient Prescriptions on File Prior to Visit  Medication Sig Dispense Refill  . diazepam (VALIUM) 5 MG tablet Take 5 mg by mouth daily as needed for anxiety.      Marland Kitchen FLUZONE HIGH-DOSE injection       . furosemide (LASIX) 20 MG tablet 20 mg. Take 1 1/2 tablets daily      . gabapentin (NEURONTIN) 300 MG capsule       . HYDROcodone-acetaminophen (NORCO/VICODIN) 5-325 MG per tablet Take 1 tablet by mouth every 6 (six) hours as needed for moderate pain.      Marland Kitchen lisinopril (PRINIVIL,ZESTRIL) 20  MG tablet Take 1 1/2 tablet daily      . metoprolol succinate (TOPROL-XL) 50 MG 24 hr tablet       . Omega-3 Fatty Acids (FISH OIL) 1000 MG CAPS Take by mouth daily.      . pantoprazole (PROTONIX) 40 MG tablet       . Probiotic Product (PROBIOTIC DAILY PO) Take by mouth.      . Red Yeast Rice Extract (RED YEAST RICE PO) Take 500 mg by mouth 2 (two) times daily.      . Vitamin D, Ergocalciferol, (DRISDOL) 50000 UNITS CAPS capsule        No current facility-administered medications on file prior to visit.     REVIEW OF SYSTEMS: Cardiovascular: No chest pain, chest pressure, palpitations, orthopnea, or dyspnea on exertion. No claudication or rest pain,  No history of DVT or phlebitis.  Positive for leg swelling Pulmonary: No productive cough, asthma or wheezing. Neurologic: No weakness, paresthesias, aphasia, or amaurosis. No dizziness. Hematologic: No bleeding problems or clotting disorders. Musculoskeletal: No joint pain or joint swelling. Gastrointestinal: No blood in stool or hematemesis Genitourinary: No dysuria or  hematuria. Psychiatric:: No history of major depression. Integumentary: No rashes or ulcers. Constitutional: No fever or chills.  PHYSICAL EXAMINATION:   Vital signs are BP 171/91  Pulse 55  Resp 16  Ht  (1.702 m)  Wt 188 lb (85.276 kg)  BMI 29.44 kg/m2  SpO2 99% General: The patient appears their stated age. HEENT:  No gross abnormalities Pulmonary:  Non labored breathing Abdomen: Soft and non-tender Musculoskeletal: There are no major deformities. Neurologic: No focal weakness or paresthesias are detected, Skin: There are no ulcer or rashes noted. Psychiatric: The patient has normal affect. Cardiovascular: There is a regular rate and rhythm without significant murmur appreciated.   Diagnostic Studies I have reviewed her CT angiogram which shows a 1 cm calcified right renal artery aneurysm  Assessment: Renal artery aneurysm, right Plan: After reviewing the CT angiogram, the maximum diameter of the aneurysm is 1 cm.  I feel this is a very low risk for rupture it would not recommend intervention at this time.  I told her that I feel this does need to be followed.  I will have her come back in one year with abdominal ultrasound.  Jorge Ny, M.D. [facility] Office: [phone] Pager:  [phone]

## 2013-10-16 NOTE — Addendum Note (Signed)
Addended by: Sharee Pimple on: 10/16/2013 05:08 PM   Modules accepted: Orders

## 2014-10-19 ENCOUNTER — Encounter: Payer: Self-pay | Admitting: Surgery

## 2014-10-22 ENCOUNTER — Ambulatory Visit: Payer: Medicare Other | Admitting: Surgery

## 2014-10-22 ENCOUNTER — Ambulatory Visit (HOSPITAL_COMMUNITY)
Admission: RE | Admit: 2014-10-22 | Discharge: 2014-10-22 | Disposition: A | Discharge status: Home / Self Care | Payer: Medicare Other | Source: Ambulatory Visit | Attending: Surgery | Admitting: Surgery

## 2014-10-22 DIAGNOSIS — I722 Aneurysm of renal artery: Secondary | ICD-10-CM | POA: Diagnosis not present

## 2014-11-09 ENCOUNTER — Encounter: Payer: Self-pay | Admitting: Surgery

## 2014-11-12 ENCOUNTER — Ambulatory Visit (INDEPENDENT_AMBULATORY_CARE_PROVIDER_SITE_OTHER): Payer: Medicare Other | Admitting: Surgery

## 2014-11-12 ENCOUNTER — Encounter: Payer: Self-pay | Admitting: Surgery

## 2014-11-12 VITALS — BP 148/78 | HR 68 | Temp 97.8°F | Resp 16 | Ht 67.0 in | Wt 171.0 lb

## 2014-11-12 DIAGNOSIS — I722 Aneurysm of renal artery: Secondary | ICD-10-CM

## 2014-11-12 NOTE — Progress Notes (Signed)
Patient name: Laurie Atkinson MRN: [id number] DOB: [birth year] Sex: female     Chief Complaint  Patient presents with  . Follow-up    1 year follow up on renal stenosis    HISTORY OF PRESENT ILLNESS: The patient is back for follow-up of a 1.5 cm right renal artery aneurysm that was initially detected on CT scan incidentally in 2014.  I saw her 1 year ago with a CT scan.  This showed that the aneurysm actually was 1.0 cm in diameter in the distal right renal artery.  She is back for ultrasound follow-up.  Since I last saw her, she has had some injections for neck pain which has helped.  She has also had improvement of her blood pressure so that she has stopped taking her metoprolol and only takes an ACE inhibitor.  She will occasionally get twinges of pain in the right upper quadrant  Past Medical History  Diagnosis Date  . Aneurysm of renal artery (HCC)   . Unspecified disorder of kidney and ureter   . Hypercalcemia   . Abnormality of gait   . Diplopia   . Other specified disorder of peritoneum   . Anxiety state, unspecified   . Renal artery aneurysm [facility]) March 2015  . Myocardial infarction [facility])     ? when    Past Surgical History  Procedure Laterality Date  . Hernia repair  04/2012    Umbilical  . Abdominal hysterectomy    . Appendectomy    . Colon surgery      Social History   Social History  . Marital Status: Married    Spouse Name: N/A  . Number of Children: N/A  . Years of Education: N/A   Occupational History  . Not on file.   Social History Main Topics  . Smoking status: Never Smoker   . Smokeless tobacco: Never Used  . Alcohol Use: No  . Drug Use: No  . Sexual Activity: Not on file   Other Topics Concern  . Not on file   Social History Narrative    Family History  Problem Relation Age of Onset  . Heart disease Mother     Heart Disease before age 48  . Stroke Mother     79  . Heart attack Mother   . Cancer Father     Throat  .  Diabetes Father   . Heart attack Father   . Cancer Sister     Breast, Brain, Kidney  . Heart disease Brother     Pacemaker- Before age 28  . Heart attack Brother   . Diabetes Sister     Allergies as of 11/12/2014 - Review Complete 11/12/2014  Allergen Reaction Noted  . Morphine and related Hives 03/16/2013  . Oxycodone Itching 03/16/2013  . Ambien [zolpidem] Anxiety 03/16/2013    Current Outpatient Prescriptions on File Prior to Visit  Medication Sig Dispense Refill  . FLUZONE HIGH-DOSE injection     . gabapentin (NEURONTIN) 300 MG capsule 3 (three) times daily.     Marland Kitchen HYDROcodone-acetaminophen (NORCO/VICODIN) 5-325 MG per tablet Take 1 tablet by mouth every 6 (six) hours as needed for moderate pain.    Marland Kitchen lisinopril (PRINIVIL,ZESTRIL) 20 MG tablet Take 1 1/2 tablet daily    . Omega-3 Fatty Acids (FISH OIL) 1000 MG CAPS Take by mouth daily.    . Probiotic Product (PROBIOTIC DAILY PO) Take by mouth.    . Red Yeast Rice Extract (RED YEAST RICE  PO) Take 500 mg by mouth 2 (two) times daily.    . Vitamin D, Ergocalciferol, (DRISDOL) 50000 UNITS CAPS capsule     . diazepam (VALIUM) 5 MG tablet Take 5 mg by mouth daily as needed for anxiety.    . furosemide (LASIX) 20 MG tablet 20 mg. Take 1 1/2 tablets daily    . metoprolol succinate (TOPROL-XL) 50 MG 24 hr tablet     . pantoprazole (PROTONIX) 40 MG tablet      No current facility-administered medications on file prior to visit.     REVIEW OF SYSTEMS: See history of present illness otherwise negative   PHYSICAL EXAMINATION:   Vital signs are  Filed Vitals:   11/12/14 0935  BP: 148/78  Pulse: 68  Temp: 97.8 F (36.6 C)  TempSrc: Oral  Resp: 16  Height:  (1.702 m)  Weight: 171 lb (77.565 kg)  SpO2: 98%   Body mass index is 26.78 kg/(m^2). General: The patient appears their stated age. HEENT:  No gross abnormalities Pulmonary:  Non labored breathing Abdomen: Soft and non-tender.  No bruits Musculoskeletal: There  are no major deformities. Neurologic: No focal weakness or paresthesias are detected, Skin: There are no ulcer or rashes noted. Psychiatric: The patient has normal affect. Cardiovascular: There is a regular rate and rhythm without significant murmur appreciated.  No carotid bruits.  Palpable pedal pulses   Diagnostic Studies I have reviewed her ultrasound.  Appears that she has a 1.2 cm aneurysm on the right renal artery  Assessment: Right renal artery aneurysm Plan: There is been no significant change in the size or aneurysm.  We have been following this for several years.  I think it is safe to change to follow up every 2 years.  She will come back with an ultrasound.  Jorge Ny, M.D. [facility] [place] Office: [phone] Pager:  [phone]

## 2014-11-12 NOTE — Addendum Note (Signed)
Addended by: Adria Dill L on: 11/12/2014 04:43 PM   Modules accepted: Orders

## 2014-11-13 NOTE — Addendum Note (Signed)
Addended by: Adria Dill L on: 11/13/2014 10:58 AM   Modules accepted: Orders

## 2015-02-15 DIAGNOSIS — D509 Iron deficiency anemia, unspecified: Secondary | ICD-10-CM | POA: Diagnosis not present

## 2015-03-19 ENCOUNTER — Other Ambulatory Visit: Payer: Self-pay | Admitting: Neurological Surgery

## 2015-03-22 ENCOUNTER — Other Ambulatory Visit: Payer: Self-pay | Admitting: Neurological Surgery

## 2015-03-22 DIAGNOSIS — M542 Cervicalgia: Secondary | ICD-10-CM

## 2015-03-29 ENCOUNTER — Encounter (HOSPITAL_COMMUNITY): Payer: Self-pay

## 2015-03-29 ENCOUNTER — Ambulatory Visit
Admission: RE | Admit: 2015-03-29 | Discharge: 2015-03-29 | Disposition: A | Discharge status: Home / Self Care | Payer: Medicare Other | Source: Ambulatory Visit | Attending: Neurological Surgery | Admitting: Neurological Surgery

## 2015-03-29 ENCOUNTER — Encounter (HOSPITAL_COMMUNITY)
Admission: RE | Admit: 2015-03-29 | Discharge: 2015-03-29 | Disposition: A | Discharge status: Home / Self Care | Payer: Medicare Other | Source: Ambulatory Visit | Attending: Neurological Surgery | Admitting: Neurological Surgery

## 2015-03-29 ENCOUNTER — Ambulatory Visit (HOSPITAL_COMMUNITY)
Admission: RE | Admit: 2015-03-29 | Discharge: 2015-03-29 | Disposition: A | Discharge status: Home / Self Care | Payer: Medicare Other | Source: Ambulatory Visit | Attending: Neurological Surgery | Admitting: Neurological Surgery

## 2015-03-29 DIAGNOSIS — Z01818 Encounter for other preprocedural examination: Secondary | ICD-10-CM | POA: Insufficient documentation

## 2015-03-29 DIAGNOSIS — M4802 Spinal stenosis, cervical region: Secondary | ICD-10-CM | POA: Diagnosis not present

## 2015-03-29 DIAGNOSIS — J984 Other disorders of lung: Secondary | ICD-10-CM | POA: Diagnosis not present

## 2015-03-29 DIAGNOSIS — M542 Cervicalgia: Secondary | ICD-10-CM

## 2015-03-29 LAB — BASIC METABOLIC PANEL
ANION GAP: 9 (ref 5–15)
BUN: 17 mg/dL (ref 6–20)
CO2: 23 mmol/L (ref 22–32)
Calcium: 11 mg/dL — ABNORMAL HIGH (ref 8.9–10.3)
Chloride: 102 mmol/L (ref 101–111)
Creatinine, Ser: 1.17 mg/dL — ABNORMAL HIGH (ref 0.44–1.00)
GFR, EST AFRICAN AMERICAN: 50 mL/min — AB (ref >60)
GFR, EST NON AFRICAN AMERICAN: 43 mL/min — AB (ref >60)
GLUCOSE: 85 mg/dL (ref 65–99)
Potassium: 4.8 mmol/L (ref 3.5–5.1)
SODIUM: 134 mmol/L — AB (ref 135–145)

## 2015-03-29 LAB — CBC WITH DIFFERENTIAL/PLATELET
Basophils Absolute: 0.1 10*3/uL (ref 0.0–0.1)
Basophils Relative: 1 %
Eosinophils Absolute: 0.2 10*3/uL (ref 0.0–0.7)
Eosinophils Relative: 5 %
HEMATOCRIT: 36.9 % (ref 36.0–46.0)
Hemoglobin: 11.9 g/dL — ABNORMAL LOW (ref 12.0–15.0)
Lymphocytes Relative: 44 %
Lymphs Abs: 2.3 10*3/uL (ref 0.7–4.0)
MCH: 27.5 pg (ref 26.0–34.0)
MCHC: 32.2 g/dL (ref 30.0–36.0)
MCV: 85.4 fL (ref 78.0–100.0)
MONO ABS: 0.5 10*3/uL (ref 0.1–1.0)
Monocytes Relative: 10 %
NEUTROS PCT: 41 %
Neutro Abs: 2.1 10*3/uL (ref 1.7–7.7)
Platelets: 157 10*3/uL (ref 150–400)
RBC: 4.32 MIL/uL (ref 3.87–5.11)
RDW: 20 % — AB (ref 11.5–15.5)
WBC: 5.2 10*3/uL (ref 4.0–10.5)

## 2015-03-29 LAB — PROTIME-INR
INR: 1.02 (ref 0.00–1.49)
Prothrombin Time: 13.6 seconds (ref 11.6–15.2)

## 2015-03-29 LAB — SURGICAL PCR SCREEN
MRSA, PCR: NEGATIVE
Staphylococcus aureus: POSITIVE — AB

## 2015-03-29 NOTE — Pre-Procedure Instructions (Addendum)
Laurie Atkinson  03/29/2015      [facility] [address] [place], [place] - [address] [place] [address] Phone: [phone] Fax: [phone]    Your procedure is scheduled on March 3rd, Friday   Report to [facility] Admitting at 5:30 AM             (Posted surgery time 7:30 - 9:09 am)   Call this number if you have problems the morning of surgery:  [phone]   Remember:  Do not eat food or drink liquids after midnight Thursday.   Take these medicines the morning of surgery with A SIP OF WATER : Norco, Omeprazole, Gabapentin              (4-5 days prior to surgery, STOP taking vitamins, herbal supplements, anti-inflammatories, blood thinners)   Do not wear jewelry, make-up or nail polish.   Do not wear lotions, powders, or perfumes.  You may NOT wear deodorant the day of surgery.   Do not shave 48 hours prior to surgery.     Do not bring valuables to the hospital.   [facility] is not responsible for any belongings or valuables.  Contacts, dentures or bridgework may not be worn into surgery.  Leave your suitcase in the car.  After surgery it may be brought to your room.  For patients admitted to the hospital, discharge time will be determined by your treatment team.   Name and phone number of your driver:   With husband                                                                  SPECIAL INSTRUCTIONS:Special Instructions: [place] - Preparing for Surgery  Before surgery, you can play an important role.  Because skin is not sterile, your skin needs to be as free of germs as possible.  You can reduce the number of germs on you skin by washing with CHG (chlorahexidine gluconate) soap before surgery.  CHG is an antiseptic cleaner which kills germs and bonds with the skin to continue killing germs even after washing.  Please DO NOT use if you have an allergy to CHG or antibacterial soaps.  If your skin becomes  reddened/irritated stop using the CHG and inform your nurse when you arrive at Short Stay.  Do not shave (including legs and underarms) for at least 48 hours prior to the first CHG shower.  You may shave your face.  Please follow these instructions carefully:   1.  Shower with CHG Soap the night before surgery and the  morning of Surgery.  2.  If you choose to wash your hair, wash your hair first as usual with your  normal shampoo.  3.  After you shampoo, rinse your hair and body thoroughly to remove the  Shampoo.  4.  Use CHG as you would any other liquid soap.  You can apply chg directly to the skin and wash gently with scrungie or a clean washcloth.  5.  Apply the CHG Soap to your body ONLY FROM THE NECK DOWN.    Do not use on open wounds or open sores.  Avoid contact with your eyes, ears, mouth and  genitals (private parts).  Wash genitals (private parts)   with your normal soap.  6.  Wash thoroughly, paying special attention to the area where your surgery will be performed.  7.  Thoroughly rinse your body with warm water from the neck down.  8.  DO NOT shower/wash with your normal soap after using and rinsing off   the CHG Soap.  9.  Pat yourself dry with a clean towel.            10.  Wear clean pajamas.            11.  Place clean sheets on your bed the night of your first shower and do not sleep with pets.  Day of Surgery  Do not apply any lotions/deodorants the morning of surgery.  Please wear clean clothes to the hospital/surgery center.    Please read over the following fact sheets that you were given. Pain Booklet, Coughing and Deep Breathing, MRSA Information and Surgical Site Infection Prevention

## 2015-04-02 NOTE — Progress Notes (Signed)
Spoke with [employer] re-requesting ECHO.  They state it is archived but will try to find it.  Re-faxed release form and they will try and locate it.

## 2015-04-04 MED ORDER — DEXAMETHASONE SODIUM PHOSPHATE 10 MG/ML IJ SOLN
10.0000 mg | INTRAMUSCULAR | Status: AC
  Administered 2015-04-05: 10 mg via INTRAVENOUS
  Filled 2015-04-04: qty 1

## 2015-04-04 MED ORDER — CEFAZOLIN SODIUM-DEXTROSE 2-3 GM-% IV SOLR
2.0000 g | INTRAVENOUS | Status: AC
  Administered 2015-04-05: 2 g via INTRAVENOUS
  Filled 2015-04-04: qty 50

## 2015-04-04 NOTE — Progress Notes (Signed)
Nurse instructed patient to arrive at 0730 instead of 0530. Patient verbalized understanding.

## 2015-04-05 ENCOUNTER — Inpatient Hospital Stay (HOSPITAL_COMMUNITY): Payer: Medicare Other

## 2015-04-05 ENCOUNTER — Inpatient Hospital Stay (HOSPITAL_COMMUNITY): Payer: Medicare Other | Admitting: Anesthesiology

## 2015-04-05 ENCOUNTER — Encounter (HOSPITAL_COMMUNITY): Payer: Self-pay | Admitting: *Deleted

## 2015-04-05 ENCOUNTER — Inpatient Hospital Stay (HOSPITAL_COMMUNITY)
Admission: RE | Admit: 2015-04-05 | Discharge: 2015-04-06 | DRG: 473 | Disposition: A | Discharge status: Home / Self Care | Payer: Medicare Other | Source: Ambulatory Visit | Attending: Neurological Surgery | Admitting: Neurological Surgery

## 2015-04-05 ENCOUNTER — Encounter (HOSPITAL_COMMUNITY)
Admission: RE | Disposition: A | Discharge status: Home / Self Care | Payer: Self-pay | Source: Ambulatory Visit | Attending: Neurological Surgery

## 2015-04-05 DIAGNOSIS — K219 Gastro-esophageal reflux disease without esophagitis: Secondary | ICD-10-CM | POA: Diagnosis present

## 2015-04-05 DIAGNOSIS — Z981 Arthrodesis status: Secondary | ICD-10-CM

## 2015-04-05 DIAGNOSIS — M47812 Spondylosis without myelopathy or radiculopathy, cervical region: Secondary | ICD-10-CM | POA: Diagnosis present

## 2015-04-05 DIAGNOSIS — I10 Essential (primary) hypertension: Secondary | ICD-10-CM | POA: Diagnosis present

## 2015-04-05 DIAGNOSIS — M5021 Other cervical disc displacement,  high cervical region: Secondary | ICD-10-CM | POA: Diagnosis present

## 2015-04-05 DIAGNOSIS — I739 Peripheral vascular disease, unspecified: Secondary | ICD-10-CM | POA: Diagnosis present

## 2015-04-05 DIAGNOSIS — Z7982 Long term (current) use of aspirin: Secondary | ICD-10-CM

## 2015-04-05 DIAGNOSIS — F419 Anxiety disorder, unspecified: Secondary | ICD-10-CM | POA: Diagnosis present

## 2015-04-05 DIAGNOSIS — I252 Old myocardial infarction: Secondary | ICD-10-CM

## 2015-04-05 DIAGNOSIS — Z419 Encounter for procedure for purposes other than remedying health state, unspecified: Secondary | ICD-10-CM

## 2015-04-05 DIAGNOSIS — F329 Major depressive disorder, single episode, unspecified: Secondary | ICD-10-CM | POA: Diagnosis present

## 2015-04-05 DIAGNOSIS — M542 Cervicalgia: Secondary | ICD-10-CM | POA: Diagnosis present

## 2015-04-05 DIAGNOSIS — M4802 Spinal stenosis, cervical region: Secondary | ICD-10-CM

## 2015-04-05 HISTORY — PX: ANTERIOR CERVICAL DECOMP/DISCECTOMY FUSION: SHX1161

## 2015-04-05 HISTORY — DX: Adverse effect of unspecified anesthetic, initial encounter: T41.45XA

## 2015-04-05 HISTORY — DX: Personal history of other medical treatment: Z92.89

## 2015-04-05 HISTORY — DX: Major depressive disorder, single episode, unspecified: F32.9

## 2015-04-05 HISTORY — DX: Headache: R51

## 2015-04-05 HISTORY — DX: Headache, unspecified: R51.9

## 2015-04-05 HISTORY — DX: Depression, unspecified: F32.A

## 2015-04-05 HISTORY — DX: Gastro-esophageal reflux disease without esophagitis: K21.9

## 2015-04-05 HISTORY — DX: Unspecified osteoarthritis, unspecified site: M19.90

## 2015-04-05 HISTORY — DX: Essential (primary) hypertension: I10

## 2015-04-05 SURGERY — ANTERIOR CERVICAL DECOMPRESSION/DISCECTOMY FUSION 1 LEVEL
Anesthesia: General

## 2015-04-05 MED ORDER — ONDANSETRON HCL 4 MG/2ML IJ SOLN
INTRAMUSCULAR | Status: DC | PRN
  Administered 2015-04-05: 4 mg via INTRAVENOUS

## 2015-04-05 MED ORDER — LISINOPRIL 20 MG PO TABS
20.0000 mg | ORAL_TABLET | Freq: Every day | ORAL | Status: DC
Start: 2015-04-06 — End: 2015-04-06
  Administered 2015-04-06: 20 mg via ORAL
  Filled 2015-04-05: qty 1

## 2015-04-05 MED ORDER — ACETAMINOPHEN 325 MG PO TABS: 650.0000 mg | ORAL_TABLET | ORAL | Status: DC | PRN

## 2015-04-05 MED ORDER — BUPIVACAINE HCL (PF) 0.25 % IJ SOLN
INTRAMUSCULAR | Status: DC | PRN
  Administered 2015-04-05: 4 mL

## 2015-04-05 MED ORDER — SUCCINYLCHOLINE CHLORIDE 20 MG/ML IJ SOLN
INTRAMUSCULAR | Status: AC
  Filled 2015-04-05: qty 1

## 2015-04-05 MED ORDER — SERTRALINE HCL 50 MG PO TABS
50.0000 mg | ORAL_TABLET | Freq: Every day | ORAL | Status: DC
  Administered 2015-04-05: 50 mg via ORAL
  Filled 2015-04-05: qty 1

## 2015-04-05 MED ORDER — LIDOCAINE HCL (CARDIAC) 20 MG/ML IV SOLN
INTRAVENOUS | Status: AC
  Filled 2015-04-05: qty 5

## 2015-04-05 MED ORDER — ONDANSETRON HCL 4 MG/2ML IJ SOLN
4.0000 mg | INTRAMUSCULAR | Status: DC | PRN
Start: 2015-04-05 — End: 2015-04-06

## 2015-04-05 MED ORDER — MIDAZOLAM HCL 2 MG/2ML IJ SOLN
INTRAMUSCULAR | Status: AC
  Filled 2015-04-05: qty 2

## 2015-04-05 MED ORDER — PHENOL 1.4 % MT LIQD: 1.0000 | OROMUCOSAL | Status: DC | PRN

## 2015-04-05 MED ORDER — FENTANYL CITRATE (PF) 100 MCG/2ML IJ SOLN
25.0000 ug | INTRAMUSCULAR | Status: DC | PRN
  Administered 2015-04-05 (×2): 25 ug via INTRAVENOUS
  Administered 2015-04-05: 50 ug via INTRAVENOUS

## 2015-04-05 MED ORDER — PROPOFOL 10 MG/ML IV BOLUS
INTRAVENOUS | Status: AC
Start: 2015-04-05 — End: 2015-04-05
  Filled 2015-04-05: qty 20

## 2015-04-05 MED ORDER — POTASSIUM CHLORIDE IN NACL 20-0.9 MEQ/L-% IV SOLN
INTRAVENOUS | Status: DC
  Filled 2015-04-05 (×3): qty 1000

## 2015-04-05 MED ORDER — THROMBIN 5000 UNITS EX SOLR
CUTANEOUS | Status: DC | PRN
  Administered 2015-04-05 (×2): 5000 [IU] via TOPICAL

## 2015-04-05 MED ORDER — SUGAMMADEX SODIUM 200 MG/2ML IV SOLN
INTRAVENOUS | Status: AC
  Filled 2015-04-05: qty 2

## 2015-04-05 MED ORDER — 0.9 % SODIUM CHLORIDE (POUR BTL) OPTIME
TOPICAL | Status: DC | PRN
  Administered 2015-04-05: 1000 mL

## 2015-04-05 MED ORDER — PROMETHAZINE HCL 25 MG/ML IJ SOLN: 6.2500 mg | INTRAMUSCULAR | Status: DC | PRN

## 2015-04-05 MED ORDER — FENTANYL CITRATE (PF) 100 MCG/2ML IJ SOLN
INTRAMUSCULAR | Status: AC
  Filled 2015-04-05: qty 2

## 2015-04-05 MED ORDER — FENTANYL CITRATE (PF) 250 MCG/5ML IJ SOLN
INTRAMUSCULAR | Status: AC
Start: 2015-04-05 — End: 2015-04-05
  Filled 2015-04-05: qty 5

## 2015-04-05 MED ORDER — ONDANSETRON HCL 4 MG/2ML IJ SOLN
INTRAMUSCULAR | Status: AC
  Filled 2015-04-05: qty 2

## 2015-04-05 MED ORDER — METHOCARBAMOL 1000 MG/10ML IJ SOLN: 500.0000 mg | Freq: Four times a day (QID) | INTRAMUSCULAR | Status: DC | PRN

## 2015-04-05 MED ORDER — SUGAMMADEX SODIUM 200 MG/2ML IV SOLN
INTRAVENOUS | Status: DC | PRN
  Administered 2015-04-05: 160 mg via INTRAVENOUS

## 2015-04-05 MED ORDER — ACETAMINOPHEN 650 MG RE SUPP: 650.0000 mg | RECTAL | Status: DC | PRN

## 2015-04-05 MED ORDER — HYDROMORPHONE HCL 1 MG/ML IJ SOLN: 0.5000 mg | INTRAMUSCULAR | Status: DC | PRN

## 2015-04-05 MED ORDER — MEPERIDINE HCL 25 MG/ML IJ SOLN: 6.2500 mg | INTRAMUSCULAR | Status: DC | PRN

## 2015-04-05 MED ORDER — CEFAZOLIN SODIUM 1-5 GM-% IV SOLN
1.0000 g | Freq: Three times a day (TID) | INTRAVENOUS | Status: AC
  Administered 2015-04-05 – 2015-04-06 (×2): 1 g via INTRAVENOUS
  Filled 2015-04-05 (×2): qty 50

## 2015-04-05 MED ORDER — ROCURONIUM BROMIDE 100 MG/10ML IV SOLN
INTRAVENOUS | Status: DC | PRN
  Administered 2015-04-05: 30 mg via INTRAVENOUS
  Administered 2015-04-05: 20 mg via INTRAVENOUS

## 2015-04-05 MED ORDER — SODIUM CHLORIDE 0.9% FLUSH: 3.0000 mL | INTRAVENOUS | Status: DC | PRN

## 2015-04-05 MED ORDER — HYDROCODONE-ACETAMINOPHEN 5-325 MG PO TABS
1.0000 | ORAL_TABLET | ORAL | Status: DC | PRN
  Administered 2015-04-05: 2 via ORAL
  Administered 2015-04-06: 1 via ORAL
  Filled 2015-04-05: qty 2
  Filled 2015-04-05: qty 1

## 2015-04-05 MED ORDER — EPHEDRINE SULFATE 50 MG/ML IJ SOLN
INTRAMUSCULAR | Status: AC
  Filled 2015-04-05: qty 1

## 2015-04-05 MED ORDER — PHENYLEPHRINE HCL 10 MG/ML IJ SOLN
INTRAMUSCULAR | Status: DC | PRN
  Administered 2015-04-05: 120 ug via INTRAVENOUS
  Administered 2015-04-05: 80 ug via INTRAVENOUS

## 2015-04-05 MED ORDER — PROPOFOL 10 MG/ML IV BOLUS
INTRAVENOUS | Status: DC | PRN
  Administered 2015-04-05: 150 mg via INTRAVENOUS

## 2015-04-05 MED ORDER — DEXTROSE 5 % IV SOLN
10.0000 mg | INTRAVENOUS | Status: DC | PRN
  Administered 2015-04-05: 25 ug/min via INTRAVENOUS

## 2015-04-05 MED ORDER — SODIUM CHLORIDE 0.9% FLUSH
3.0000 mL | Freq: Two times a day (BID) | INTRAVENOUS | Status: DC
  Administered 2015-04-05: 3 mL via INTRAVENOUS

## 2015-04-05 MED ORDER — LACTATED RINGERS IV SOLN: INTRAVENOUS | Status: DC

## 2015-04-05 MED ORDER — GELATIN ABSORBABLE MT POWD
OROMUCOSAL | Status: DC | PRN
  Administered 2015-04-05: 11:00:00 via TOPICAL

## 2015-04-05 MED ORDER — LACTATED RINGERS IV SOLN
INTRAVENOUS | Status: DC
  Administered 2015-04-05 (×3): via INTRAVENOUS

## 2015-04-05 MED ORDER — ROCURONIUM BROMIDE 50 MG/5ML IV SOLN
INTRAVENOUS | Status: AC
Start: 2015-04-05 — End: 2015-04-05
  Filled 2015-04-05: qty 1

## 2015-04-05 MED ORDER — LIDOCAINE HCL 4 % EX SOLN
CUTANEOUS | Status: DC | PRN
  Administered 2015-04-05: 4 mL via TOPICAL

## 2015-04-05 MED ORDER — FENTANYL CITRATE (PF) 100 MCG/2ML IJ SOLN
INTRAMUSCULAR | Status: DC | PRN
  Administered 2015-04-05: 50 ug via INTRAVENOUS
  Administered 2015-04-05: 100 ug via INTRAVENOUS

## 2015-04-05 MED ORDER — GABAPENTIN 300 MG PO CAPS
300.0000 mg | ORAL_CAPSULE | Freq: Three times a day (TID) | ORAL | Status: DC
  Administered 2015-04-05: 300 mg via ORAL
  Filled 2015-04-05: qty 1

## 2015-04-05 MED ORDER — MENTHOL 3 MG MT LOZG: 1.0000 | LOZENGE | OROMUCOSAL | Status: DC | PRN

## 2015-04-05 MED ORDER — EPHEDRINE SULFATE 50 MG/ML IJ SOLN
INTRAMUSCULAR | Status: DC | PRN
  Administered 2015-04-05: 15 mg via INTRAVENOUS
  Administered 2015-04-05: 10 mg via INTRAVENOUS

## 2015-04-05 MED ORDER — METHOCARBAMOL 500 MG PO TABS: 500.0000 mg | ORAL_TABLET | Freq: Four times a day (QID) | ORAL | Status: DC | PRN

## 2015-04-05 MED ORDER — HEMOSTATIC AGENTS (NO CHARGE) OPTIME
TOPICAL | Status: DC | PRN
  Administered 2015-04-05: 1 via TOPICAL

## 2015-04-05 MED ORDER — SODIUM CHLORIDE 0.9 % IJ SOLN
INTRAMUSCULAR | Status: AC
  Filled 2015-04-05: qty 10

## 2015-04-05 MED ORDER — SODIUM CHLORIDE 0.9 % IR SOLN
Status: DC | PRN
  Administered 2015-04-05: 10:00:00

## 2015-04-05 MED ORDER — LIDOCAINE HCL (CARDIAC) 20 MG/ML IV SOLN
INTRAVENOUS | Status: DC | PRN
  Administered 2015-04-05: 60 mg via INTRAVENOUS

## 2015-04-05 SURGICAL SUPPLY — 48 items
APL SKNCLS STERI-STRIP NONHPOA (GAUZE/BANDAGES/DRESSINGS) ×1
BAG DECANTER FOR FLEXI CONT (MISCELLANEOUS) ×3 IMPLANT
BENZOIN TINCTURE PRP APPL 2/3 (GAUZE/BANDAGES/DRESSINGS) ×3 IMPLANT
BIT DRILL POWER (BIT) IMPLANT
BONE CERV LORDOTIC 14.5X12X6 (Bone Implant) ×3 IMPLANT
BUR MATCHSTICK NEURO 3.0 LAGG (BURR) ×3 IMPLANT
CANISTER SUCT 3000ML PPV (MISCELLANEOUS) ×3 IMPLANT
CLOSURE WOUND 1/2 X4 (GAUZE/BANDAGES/DRESSINGS) ×1
DRAPE C-ARM 42X72 X-RAY (DRAPES) ×6 IMPLANT
DRAPE LAPAROTOMY 100X72 PEDS (DRAPES) ×3 IMPLANT
DRAPE MICROSCOPE LEICA (MISCELLANEOUS) ×3 IMPLANT
DRAPE POUCH INSTRU U-SHP 10X18 (DRAPES) ×3 IMPLANT
DRILL BIT POWER (BIT)
DRSG OPSITE POSTOP 3X4 (GAUZE/BANDAGES/DRESSINGS) ×2 IMPLANT
DURAPREP 6ML APPLICATOR 50/CS (WOUND CARE) ×3 IMPLANT
ELECT COATED BLADE 2.86 ST (ELECTRODE) ×3 IMPLANT
ELECT REM PT RETURN 9FT ADLT (ELECTROSURGICAL) ×3
ELECTRODE REM PT RTRN 9FT ADLT (ELECTROSURGICAL) ×1 IMPLANT
GAUZE SPONGE 4X4 16PLY XRAY LF (GAUZE/BANDAGES/DRESSINGS) IMPLANT
GLOVE BIO SURGEON STRL SZ8 (GLOVE) ×3 IMPLANT
GLOVE ECLIPSE 6.5 STRL STRAW (GLOVE) ×2 IMPLANT
GOWN STRL REUS W/ TWL LRG LVL3 (GOWN DISPOSABLE) IMPLANT
GOWN STRL REUS W/ TWL XL LVL3 (GOWN DISPOSABLE) IMPLANT
GOWN STRL REUS W/TWL 2XL LVL3 (GOWN DISPOSABLE) ×3 IMPLANT
GOWN STRL REUS W/TWL LRG LVL3 (GOWN DISPOSABLE)
GOWN STRL REUS W/TWL XL LVL3 (GOWN DISPOSABLE)
GRAFT BNE SPCR VG2 14.5X12X6 (Bone Implant) IMPLANT
HEMOSTAT POWDER KIT SURGIFOAM (HEMOSTASIS) ×3 IMPLANT
KIT BASIN OR (CUSTOM PROCEDURE TRAY) ×3 IMPLANT
KIT ROOM TURNOVER OR (KITS) ×3 IMPLANT
NDL HYPO 25X1 1.5 SAFETY (NEEDLE) ×1 IMPLANT
NDL SPNL 20GX3.5 QUINCKE YW (NEEDLE) ×1 IMPLANT
NEEDLE HYPO 25X1 1.5 SAFETY (NEEDLE) ×3 IMPLANT
NEEDLE SPNL 20GX3.5 QUINCKE YW (NEEDLE) ×3 IMPLANT
NS IRRIG 1000ML POUR BTL (IV SOLUTION) ×3 IMPLANT
PACK LAMINECTOMY NEURO (CUSTOM PROCEDURE TRAY) ×3 IMPLANT
PAD ARMBOARD 7.5X6 YLW CONV (MISCELLANEOUS) ×3 IMPLANT
PLATE ARCHON 1-LEVEL 22MM (Plate) ×2 IMPLANT
RUBBERBAND STERILE (MISCELLANEOUS) ×6 IMPLANT
SCREW ARCHON SELFTAP 4.0X13 (Screw) ×8 IMPLANT
SPONGE INTESTINAL PEANUT (DISPOSABLE) ×3 IMPLANT
SPONGE SURGIFOAM ABS GEL SZ50 (HEMOSTASIS) ×3 IMPLANT
STRIP CLOSURE SKIN 1/2X4 (GAUZE/BANDAGES/DRESSINGS) ×2 IMPLANT
SUT VIC AB 3-0 SH 8-18 (SUTURE) ×3 IMPLANT
TOWEL OR 17X24 6PK STRL BLUE (TOWEL DISPOSABLE) ×3 IMPLANT
TOWEL OR 17X26 10 PK STRL BLUE (TOWEL DISPOSABLE) ×3 IMPLANT
TRAP SPECIMEN MUCOUS 40CC (MISCELLANEOUS) ×2 IMPLANT
WATER STERILE IRR 1000ML POUR (IV SOLUTION) ×3 IMPLANT

## 2015-04-05 NOTE — Progress Notes (Signed)
Orthopedic Tech Progress Note Patient Details:  Laurie NakayamaBillie F Atkinson [birth year] [id number]  Ortho Devices Type of Ortho Device: Soft collar Ortho Device/Splint Interventions: Application   Saul FordyceJennifer C Dorita Rowlands 04/05/2015, 11:43 AM

## 2015-04-05 NOTE — H&P (Signed)
Subjective:   Patient is a 82 y.o. female admitted for ACDF. The patient first presented to me with complaints of neck pain. Onset of symptoms was several months ago. The pain is described as aching and occurs all day. The pain is rated moderate, and is located  In the neck and radiates to the R ear. The symptoms have been progressive. Symptoms are exacerbated by extending head backwards, and are relieved by none.  Previous work up includes MRI of cervical spine, results: spinal stenosis.  Past Medical History  Diagnosis Date  . Aneurysm of renal artery (HCC)   . Unspecified disorder of kidney and ureter   . Hypercalcemia   . Abnormality of gait   . Diplopia   . Other specified disorder of peritoneum   . Anxiety state, unspecified   . Renal artery aneurysm [facility]) March 2015  . Complication of anesthesia 1980'Atkinson    decreased BP from medicine relative anesth. & her surgery got cancelled , but had surgery the next week, with no problems   . Hypertension   . Myocardial infarction (HCC) 1960'Atkinson     ? when  . H/O echocardiogram 2014    [facility] for ? indigestion, told that it was wnl  . Depression   . GERD (gastroesophageal reflux disease)   . Headache     distant history of  . History of blood transfusion     with colon surgery   . Arthritis     knees & neck     Past Surgical History  Procedure Laterality Date  . Hernia repair  04/2012    Umbilical  . Abdominal hysterectomy    . Appendectomy    . Colon surgery    . Tonsillectomy    . Cholecystectomy    . Cardiac catheterization  1960'Atkinson?    told- no blockages   . Hand surgery      thumb joint replacement     Allergies  Allergen Reactions  . Morphine And Related Hives  . Oxycodone Itching  . Percocet [Oxycodone-Acetaminophen] Itching  . Ambien [Zolpidem] Anxiety    Social History  Substance Use Topics  . Smoking status: Never Smoker   . Smokeless tobacco: Never Used  . Alcohol Use: No    Family History  Problem  Relation Age of Onset  . Heart disease Mother     Heart Disease before age 22  . Stroke Mother     74  . Heart attack Mother   . Cancer Father     Throat  . Diabetes Father   . Heart attack Father   . Cancer Sister     Breast, Brain, Kidney  . Heart disease Brother     Pacemaker- Before age 8  . Heart attack Brother   . Diabetes Sister    Prior to Admission medications   Medication Sig Start Date End Date Taking? Authorizing Provider  aspirin 81 MG tablet Take 81 mg by mouth daily.   Yes Historical Provider, MD  gabapentin (NEURONTIN) 300 MG capsule Take 300 mg by mouth 3 (three) times daily.  02/07/13  Yes Historical Provider, MD  HYDROcodone-acetaminophen (NORCO) 10-325 MG tablet Take 1 tablet by mouth 2 (two) times daily. 02/07/15  Yes Historical Provider, MD  lisinopril (PRINIVIL,ZESTRIL) 20 MG tablet Take 20 mg by mouth daily before breakfast.  04/21/13  Yes Historical Provider, MD  Omega-3 Fatty Acids (FISH OIL) 1000 MG CAPS Take 1,000 mg by mouth daily.    Yes Historical Provider,  MD  omeprazole (PRILOSEC) 20 MG capsule Take 20 mg by mouth at bedtime. Reported on 03/26/2015   Yes Historical Provider, MD  Probiotic Product (PROBIOTIC DAILY PO) Take 1 tablet by mouth daily.    Yes Historical Provider, MD  Red Yeast Rice Extract (RED YEAST RICE PO) Take 500 mg by mouth 2 (two) times daily.   Yes Historical Provider, MD  sertraline (ZOLOFT) 50 MG tablet Take 50 mg by mouth at bedtime. 01/22/15  Yes Historical Provider, MD  sodium chloride (OCEAN) 0.65 % SOLN nasal spray Place 1 spray into both nostrils at bedtime as needed for congestion.   Yes Historical Provider, MD     Review of Systems  Positive ROS: neg  All other systems have been reviewed and were otherwise negative with the exception of those mentioned in the HPI and as above.  Objective: Vital signs in last 24 hours: Temp:  [98.4 F (36.9 C)] 98.4 F (36.9 C) (03/03 0751) Pulse Rate:  [62] 62 (03/03 0751) Resp:   [18] 18 (03/03 0751) BP: (133)/(51) 133/51 mmHg (03/03 0751) SpO2:  [98 %] 98 % (03/03 0751) Weight:  [79.901 kg (176 lb 2.4 oz)] 79.901 kg (176 lb 2.4 oz) (03/03 0800)  General Appearance: Alert, cooperative, no distress, appears stated age Head: Normocephalic, without obvious abnormality, atraumatic Eyes: PERRL, conjunctiva/corneas clear, EOM'Atkinson intact      Neck: Supple, symmetrical, trachea midline, Back: Symmetric, no curvature, ROM normal, no CVA tenderness Lungs:  respirations unlabored Heart: Regular rate and rhythm Abdomen: Soft, non-tender Extremities: Extremities normal, atraumatic, no cyanosis or edema Pulses: 2+ and symmetric all extremities Skin: Skin color, texture, turgor normal, no rashes or lesions  NEUROLOGIC:  Mental status: Alert and oriented x4, no aphasia, good attention span, fund of knowledge and memory  Motor Exam - grossly normal Sensory Exam - grossly normal Reflexes: 1+ Coordination - grossly normal Gait - grossly normal Balance - grossly normal Cranial Nerves: I: smell Not tested  II: visual acuity  OS: nl    OD: nl  II: visual fields Full to confrontation  II: pupils Equal, round, reactive to light  III,VII: ptosis None  III,IV,VI: extraocular muscles  Full ROM  V: mastication Normal  V: facial light touch sensation  Normal  V,VII: corneal reflex  Present  VII: facial muscle function - upper  Normal  VII: facial muscle function - lower Normal  VIII: hearing Not tested  IX: soft palate elevation  Normal  IX,X: gag reflex Present  XI: trapezius strength  5/5  XI: sternocleidomastoid strength 5/5  XI: neck flexion strength  5/5  XII: tongue strength  Normal    Data Review Lab Results  Component Value Date   WBC 5.2 03/29/2015   HGB 11.9* 03/29/2015   HCT 36.9 03/29/2015   MCV 85.4 03/29/2015   PLT 157 03/29/2015   Lab Results  Component Value Date   NA 134* 03/29/2015   K 4.8 03/29/2015   CL 102 03/29/2015   CO2 23 03/29/2015    BUN 17 03/29/2015   CREATININE 1.17* 03/29/2015   GLUCOSE 85 03/29/2015   Lab Results  Component Value Date   INR 1.02 03/29/2015    Assessment:   Cervical neck pain with herniated nucleus pulposus/ spondylosis/ stenosis at C3-4. Patient has failed conservative therapy. Planned surgery : ACDF C3-4  Plan:   I explained the condition and procedure to the patient and answered any questions.  Patient wishes to proceed with procedure as planned. Understands risks/ benefits/  and expected or typical outcomes.  Laurie Atkinson 04/05/2015 9:26 AM

## 2015-04-05 NOTE — Anesthesia Postprocedure Evaluation (Signed)
Anesthesia Post Note  Patient: Laurie NakayamaBillie F Atkinson  Procedure(s) Performed: Procedure(s) (LRB): Anterior Cervical Discectomy Decompression Fusion - Cervical three-Cervical four  (N/A)  Patient location during evaluation: PACU Anesthesia Type: General Level of consciousness: awake and alert Pain management: pain level controlled Vital Signs Assessment: post-procedure vital signs reviewed and stable Respiratory status: spontaneous breathing, nonlabored ventilation, respiratory function stable and patient connected to nasal cannula oxygen Cardiovascular status: blood pressure returned to baseline and stable Postop Assessment: no signs of nausea or vomiting Anesthetic complications: no    Last Vitals:  Filed Vitals:   04/05/15 1208 04/05/15 1223  BP: 155/66 152/66  Pulse: 88 90  Temp:  36.4 C  Resp: 9 15    Last Pain:  Filed Vitals:   04/05/15 1236  PainSc: 0-No pain                 Shelton SilvasKevin D Sherese Heyward

## 2015-04-05 NOTE — Op Note (Signed)
04/05/2015  11:18 AM  PATIENT:  Laurie Atkinson  82 y.o. female  PRE-OPERATIVE DIAGNOSIS:  cervical spondylosis  POST-OPERATIVE DIAGNOSIS:  same  PROCEDURE:  1. Decompressive anterior cervical discectomy C3-4, 2. Anterior cervical arthrodesis C3-4 utilizing a cortical cancellus allograft, 3. Anterior cervical plating C3-4 utilizing a nuvasive archon plate  SURGEON:  Marikay Alaravid Arriana Lohmann, MD  ASSISTANTS: Franky Machoabbell  ANESTHESIA:   General  EBL: 50 ml  Total I/O In: 1000 [I.V.:1000] Out: 50 [Blood:50]  BLOOD ADMINISTERED:none  DRAINS: none   SPECIMEN:  No Specimen  INDICATION FOR PROCEDURE: This patient presented with right-sided neck pain which radiated up into the head and ear. She had a CT scan and MRI which showed spondylosis. She had severe foraminal stenosis at C3-4 on the right. I felt clinically this is her pain generator. She tried medical management without relief. I recommended ACDF with plating C3-4. Patient understood the risks, benefits, and alternatives and potential outcomes and wished to proceed.  PROCEDURE DETAILS: Patient was brought to the operating room placed under general endotracheal anesthesia. Patient was placed in the supine position on the operating room table. The neck was prepped with Duraprep and draped in a sterile fashion.   Three cc of local anesthesia was injected and a transverse incision was made on the right side of the neck.  Dissection was carried down thru the subcutaneous tissue and the platysma was  elevated, opened, and undermined with Metzenbaum scissors.  Dissection was then carried out thru an avascular plane leaving the sternocleidomastoid carotid artery and jugular vein laterally and the trachea and esophagus medially. The ventral aspect of the vertebral column was identified and a localizing x-ray was taken. The C3-4 level was identified. The longus colli muscles were then elevated and the retractor was placed. The annulus was incised and the disc  space entered. Discectomy was performed with micro-curettes and pituitary rongeurs. I then used the high-speed drill to drill the endplates down to the level of the posterior longitudinal ligament. The drill shavings were saved in a mucous trap for later arthrodesis. The operating microscope was draped and brought into the field provided additional magnification, illumination and visualization. Discectomy was continued posteriorly thru the disc space. Posterior longitudinal ligament was opened with a nerve hook, and then removed along with disc herniation and osteophytes, decompressing the spinal canal and thecal sac. We then continued to remove osteophytic overgrowth and disc material decompressing the neural foramina and exiting nerve roots bilaterally. The scope was angled up and down to help decompress and undercut the vertebral bodies. Once the decompression was completed we could pass a nerve hook circumferentially to assure adequate decompression in the midline and in the neural foramina. So by both visualization and palpation we felt we had an adequate decompression of the neural elements. We then measured the height of the intravertebral disc space and selected a 6 mm millimeter allograft. It was then gently positioned in the intravertebral disc space and countersunk. I then used a 22 mm plate and placed four variable angle screws into the vertebral bodies and locked them into position. The wound was irrigated with bacitracin solution, checked for hemostasis which was established and confirmed. Once meticulous hemostasis was achieved, we then proceeded with closure. The platysma was closed with interrupted 3-0 undyed Vicryl suture, the subcuticular layer was closed with interrupted 3-0 undyed Vicryl suture. The skin edges were approximated with steristrips. The drapes were removed. A sterile dressing was applied. The patient was then awakened from general anesthesia and  transferred to the recovery room in  stable condition. At the end of the procedure all sponge, needle and instrument counts were correct.   PLAN OF CARE: Admit to inpatient   PATIENT DISPOSITION:  PACU - hemodynamically stable.   Delay start of Pharmacological VTE agent (>24hrs) due to surgical blood loss or risk of bleeding:  yes

## 2015-04-05 NOTE — Anesthesia Preprocedure Evaluation (Addendum)
Anesthesia Evaluation  Patient identified by MRN, date of birth, ID band Patient awake    Reviewed: Allergy & Precautions, NPO status , Patient's Chart, lab work & pertinent test results  History of Anesthesia Complications (+) history of anesthetic complications  Airway Mallampati: II  TM Distance: >3 FB Neck ROM: Limited    Dental  (+) Partial Lower, Partial Upper, Dental Advisory Given   Pulmonary neg pulmonary ROS,    breath sounds clear to auscultation       Cardiovascular hypertension, + Past MI and + Peripheral Vascular Disease   Rhythm:Regular Rate:Normal     Neuro/Psych  Headaches, PSYCHIATRIC DISORDERS Anxiety Depression    GI/Hepatic Neg liver ROS, GERD  Medicated,  Endo/Other  negative endocrine ROS  Renal/GU Renal disease  negative genitourinary   Musculoskeletal  (+) Arthritis ,   Abdominal   Peds negative pediatric ROS (+)  Hematology negative hematology ROS (+)   Anesthesia Other Findings - renal Artery Aneurysm  Reproductive/Obstetrics negative OB ROS                           Lab Results  Component Value Date   WBC 5.2 03/29/2015   HGB 11.9* 03/29/2015   HCT 36.9 03/29/2015   MCV 85.4 03/29/2015   PLT 157 03/29/2015   . Lab Results  Component Value Date   CREATININE 1.17* 03/29/2015   BUN 17 03/29/2015   NA 134* 03/29/2015   K 4.8 03/29/2015   CL 102 03/29/2015   CO2 23 03/29/2015   Lab Results  Component Value Date   INR 1.02 03/29/2015   03/2015 EKG: normal sinus rhythm.    Anesthesia Physical Anesthesia Plan  ASA: III  Anesthesia Plan: General   Post-op Pain Management:    Induction: Intravenous  Airway Management Planned: Oral ETT and Video Laryngoscope Planned  Additional Equipment:   Intra-op Plan:   Post-operative Plan: Extubation in OR  Informed Consent: I have reviewed the patients History and Physical, chart, labs and discussed  the procedure including the risks, benefits and alternatives for the proposed anesthesia with the patient or authorized representative who has indicated his/her understanding and acceptance.   Dental advisory given  Plan Discussed with: CRNA  Anesthesia Plan Comments:        Anesthesia Quick Evaluation

## 2015-04-05 NOTE — Anesthesia Procedure Notes (Signed)
Procedure Name: Intubation Date/Time: 04/05/2015 9:44 AM Performed by: Trixie Deis A Pre-anesthesia Checklist: Patient identified, Timeout performed, Emergency Drugs available, Suction available and Patient being monitored Patient Re-evaluated:Patient Re-evaluated prior to inductionOxygen Delivery Method: Circle system utilized Preoxygenation: Pre-oxygenation with 100% oxygen Intubation Type: IV induction Ventilation: Mask ventilation without difficulty Laryngoscope Size: Glidescope and 3 Grade View: Grade I Tube type: Oral Tube size: 7.0 mm Number of attempts: 1 Airway Equipment and Method: Rigid stylet and LTA kit utilized Placement Confirmation: ETT inserted through vocal cords under direct vision,  breath sounds checked- equal and bilateral and positive ETCO2 Secured at: 21 cm Tube secured with: Tape Dental Injury: Teeth and Oropharynx as per pre-operative assessment

## 2015-04-05 NOTE — Transfer of Care (Signed)
Immediate Anesthesia Transfer of Care Note  Patient: Laurie NakayamaBillie F Atkinson  Procedure(s) Performed: Procedure(s): Anterior Cervical Discectomy Decompression Fusion - Cervical three-Cervical four  (N/A)  Patient Location: PACU  Anesthesia Type:General  Level of Consciousness: awake, alert  and oriented  Airway & Oxygen Therapy: Patient Spontanous Breathing and Patient connected to nasal cannula oxygen  Post-op Assessment: Report given to RN, Post -op Vital signs reviewed and stable and Patient moving all extremities  Post vital signs: Reviewed and stable  Last Vitals:  Filed Vitals:   04/05/15 0751  BP: 133/51  Pulse: 62  Temp: 36.9 C  Resp: 18    Complications: No apparent anesthesia complications

## 2015-04-06 MED ORDER — TIZANIDINE HCL 4 MG PO TABS: 4.0000 mg | ORAL_TABLET | Freq: Four times a day (QID) | ORAL | Status: DC | PRN

## 2015-04-06 MED ORDER — HYDROCODONE-ACETAMINOPHEN 5-325 MG PO TABS: 1.0000 | ORAL_TABLET | Freq: Four times a day (QID) | ORAL | Status: DC | PRN

## 2015-04-06 NOTE — Progress Notes (Signed)
Pt. discharged home accompanied by husband. Prescriptions and discharge instructions given with verbalization of understanding. Incision site on neck with no s/s of infection - no swelling, redness, bleeding, and/or drainage noted. Soft collar intact. Opportunity given to ask questions but no question asked. Pt. transported out of this unit in wheelchair by the volunteer   

## 2015-04-06 NOTE — Discharge Instructions (Signed)
Wound Care °Keep incision covered and dry for 3 days    °You may remove outer bandage after 3 days. °Do not put any creams, lotions, or ointments on incision. °Leave steri-strips on neck.  They will fall off by themselves. °Activity °Walk each and every day, increasing distance each day. °No lifting greater than 8 lbs.  Avoid excessive neck motion. °No driving for 2 weeks; may ride as a passenger locally. ° °Diet °Resume your normal soft diet.  °Return to Work °Will be discussed at you follow up appointment. °Call Your Doctor If Any of These Occur °Redness, drainage, or swelling at the wound.  °Temperature greater than 101 degrees. °Severe pain not relieved by pain medication. °Increased difficulty swallowing.  °Incision starts to come apart. °Follow Up Appt °Call today for appointment in 1-2 weeks ([phone]) or for problems.  If you have any hardware placed in your spine, you will need an x-ray before your appointment. ° °

## 2015-04-06 NOTE — Discharge Summary (Signed)
Physician Discharge Summary  Patient ID: Laurie NakayamaBillie F Atkinson MRN: [id number] DOB/AGE: Apr 28, 1933 82 y.o.  Admit date: 04/05/2015 Discharge date: 04/06/2015  Admission Diagnoses:Cervical spondylosis  Discharge Diagnoses: same Active Problems:   S/P cervical spinal fusion   Discharged Condition: good  Hospital Course: [name] was admitted and taken to the operating room for an uncomplicated ACDF at C3/4. Her wound is clean, dry, and no signs of infection. She has full strength in the upper extremities . She is voiding, ambulating, and tolerating a regular diet.   Consults: None  Significant Diagnostic Studies: none  Treatments: surgery: Decompressive anterior cervical discectomy C3-4, 2. Anterior cervical arthrodesis C3-4 utilizing a cortical cancellus allograft, 3. Anterior cervical plating C3-4 utilizing a nuvasive archon plate   Discharge Exam: Blood pressure 118/52, pulse 85, temperature 98.3 F (36.8 C), temperature source Oral, resp. rate 16, height 5\' 7"  (1.702 m), weight 176 lb 2.4 oz (79.901 kg), SpO2 94 %. General appearance: alert, cooperative, appears stated age and no distress Neurologic: Alert and oriented X 3, normal strength and tone. Normal symmetric reflexes. Normal coordination and gait  Disposition:      Medication List    TAKE these medications        aspirin 81 MG tablet  Take 81 mg by mouth daily.     Fish Oil 1000 MG Caps  Take 1,000 mg by mouth daily.     gabapentin 300 MG capsule  Commonly known as:  NEURONTIN  Take 300 mg by mouth 3 (three) times daily.     HYDROcodone-acetaminophen 10-325 MG tablet  Commonly known as:  NORCO  Take 1 tablet by mouth 2 (two) times daily.     HYDROcodone-acetaminophen 5-325 MG tablet  Commonly known as:  NORCO/VICODIN  Take 1 tablet by mouth every 6 (six) hours as needed for moderate pain.     lisinopril 20 MG tablet  Commonly known as:  PRINIVIL,ZESTRIL  Take 20 mg by mouth daily before breakfast.     omeprazole 20 MG capsule  Commonly known as:  PRILOSEC  Take 20 mg by mouth at bedtime. Reported on 03/26/2015     PROBIOTIC DAILY PO  Take 1 tablet by mouth daily.     RED YEAST RICE PO  Take 500 mg by mouth 2 (two) times daily.     sertraline 50 MG tablet  Commonly known as:  ZOLOFT  Take 50 mg by mouth at bedtime.     sodium chloride 0.65 % Soln nasal spray  Commonly known as:  OCEAN  Place 1 spray into both nostrils at bedtime as needed for congestion.         Signed: Anira Senegal L 04/06/2015, 10:13 AM

## 2015-04-08 ENCOUNTER — Encounter (HOSPITAL_COMMUNITY): Payer: Self-pay | Admitting: Neurological Surgery

## 2015-04-29 DIAGNOSIS — D509 Iron deficiency anemia, unspecified: Secondary | ICD-10-CM | POA: Diagnosis not present

## 2015-11-06 DIAGNOSIS — K668 Other specified disorders of peritoneum: Secondary | ICD-10-CM | POA: Diagnosis not present

## 2015-11-07 DIAGNOSIS — K668 Other specified disorders of peritoneum: Secondary | ICD-10-CM

## 2015-11-08 DIAGNOSIS — K668 Other specified disorders of peritoneum: Secondary | ICD-10-CM | POA: Diagnosis not present

## 2015-11-09 DIAGNOSIS — K668 Other specified disorders of peritoneum: Secondary | ICD-10-CM | POA: Diagnosis not present

## 2015-12-09 DIAGNOSIS — Z1509 Genetic susceptibility to other malignant neoplasm: Secondary | ICD-10-CM | POA: Diagnosis not present

## 2015-12-09 DIAGNOSIS — Z803 Family history of malignant neoplasm of breast: Secondary | ICD-10-CM | POA: Diagnosis not present

## 2015-12-09 DIAGNOSIS — Z1501 Genetic susceptibility to malignant neoplasm of breast: Secondary | ICD-10-CM | POA: Diagnosis not present

## 2015-12-09 DIAGNOSIS — Z808 Family history of malignant neoplasm of other organs or systems: Secondary | ICD-10-CM | POA: Diagnosis not present

## 2016-04-28 DIAGNOSIS — D509 Iron deficiency anemia, unspecified: Secondary | ICD-10-CM | POA: Diagnosis not present

## 2016-08-12 DIAGNOSIS — D509 Iron deficiency anemia, unspecified: Secondary | ICD-10-CM | POA: Diagnosis not present

## 2016-11-16 ENCOUNTER — Ambulatory Visit (INDEPENDENT_AMBULATORY_CARE_PROVIDER_SITE_OTHER): Payer: Medicare Other | Admitting: Family

## 2016-11-16 ENCOUNTER — Ambulatory Visit (HOSPITAL_COMMUNITY)
Admission: RE | Admit: 2016-11-16 | Discharge: 2016-11-16 | Disposition: A | Discharge status: Home / Self Care | Payer: Medicare Other | Source: Ambulatory Visit | Attending: Family | Admitting: Family

## 2016-11-16 ENCOUNTER — Encounter: Payer: Self-pay | Admitting: Family

## 2016-11-16 VITALS — BP 146/79 | HR 63 | Temp 97.8°F | Resp 18 | Ht 67.0 in | Wt 178.0 lb

## 2016-11-16 DIAGNOSIS — I722 Aneurysm of renal artery: Secondary | ICD-10-CM | POA: Insufficient documentation

## 2016-11-16 NOTE — Progress Notes (Signed)
CC: Follow up Right Renal Artery Aneurysm   History of Present Illness  Laurie Atkinson is a 83 y.o. 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   Dr. Myra Gianotti saw her in 2015 with a CT scan.  This showed that the aneurysm actually was 1.0 cm in diameter in the distal right renal artery.    She has had some injections for neck pain which has helped.  She has also had improvement of her blood pressure so that she has stopped taking her metoprolol and only takes an ARB, no longer takes lisinopril.  She will occasionally get twinges of pain in the right upper quadrant.  Dr. Myra Gianotti last evaluated pt on 11-12-14. At that time renal artery duplex demonstrated a 1.2 cm aneurysm on the right renal artery There had been no significant change in the size or aneurysm.  We have been following this for several years.  Dr. Myra Gianotti indicated it was therefore safe to follow up every 2 years, next visit with an ultrasound.  She had c-spine surgery about 2016, anterior approach.  She denies any hx of stroke or TIA.   She does have a hx of IDA with iron infusions.   Daughter state pt calcium and sodium are low, and that her PCP will be referring her to an endocrinologist, state her parathyroid is :not functioning".   She feels her arm strength and general strength are low, her walking seems limited by this, and not claudication.  She denies tingling or numbness around her mouth.   The patient's blood pressure has been labile, per pt and husband: 180/90 several times in one day recently, other times 150/70 at home; pt states drinking vinegar will lower her high blood pressure.  The patient's blood pressure medication regimen has remained stable.  The patient's urinary history has remained stable, but her fluid intake has been limited by her PCP due due her recent dx of hyponatremia.  Past Medical  History:  Diagnosis Date  . Abnormality of gait   . Aneurysm of renal artery (HCC)   . Anxiety state, unspecified   . Arthritis    knees & neck   . Complication of anesthesia 1980's   decreased BP from medicine relative anesth. & her surgery got cancelled , but had surgery the next week, with no problems   . Depression   . Diplopia   . GERD (gastroesophageal reflux disease)   . H/O echocardiogram 2014   [facility] for ? indigestion, told that it was wnl  . Headache    distant history of  . History of blood transfusion    with colon surgery   . Hypercalcemia   . Hypertension   . Myocardial infarction (HCC) 1960's    ? when  . Other specified disorder of peritoneum   . Renal artery aneurysm [facility]) March 2015  . Unspecified disorder of kidney and ureter     Social History Social History  Substance Use Topics  . Smoking status: Never Smoker  . Smokeless tobacco: Never Used  . Alcohol use No    Family History Family History  Problem Relation Age of Onset  . Heart disease Mother        Heart Disease before age 85  . Stroke Mother        32  . Heart attack Mother   . Cancer Father  Throat  . Diabetes Father   . Heart attack Father   . Cancer Sister        Breast, Brain, Kidney  . Heart disease Brother        Pacemaker- Before age 99  . Heart attack Brother   . Diabetes Sister     Surgical History Past Surgical History:  Procedure Laterality Date  . ABDOMINAL HYSTERECTOMY    . ANTERIOR CERVICAL DECOMP/DISCECTOMY FUSION N/A 04/05/2015   Procedure: Anterior Cervical Discectomy Decompression Fusion - Cervical three-Cervical four ;  Surgeon: Tia Alert, MD;  Location: [facility];  Service: Neurosurgery;  Laterality: N/A;  . APPENDECTOMY    . CARDIAC CATHETERIZATION  1960's?   told- no blockages   . CHOLECYSTECTOMY    . COLON SURGERY    . HAND SURGERY     thumb joint replacement   . HERNIA REPAIR  04/2012   Umbilical  . TONSILLECTOMY       Allergies  Allergen Reactions  . Morphine And Related Hives  . Oxycodone Itching  . Percocet [Oxycodone-Acetaminophen] Itching  . Ambien [Zolpidem] Anxiety    Current Outpatient Prescriptions  Medication Sig Dispense Refill  . aspirin 81 MG tablet Take 81 mg by mouth daily.    Marland Kitchen gabapentin (NEURONTIN) 300 MG capsule Take 300 mg by mouth 3 (three) times daily.     Marland Kitchen olmesartan (BENICAR) 20 MG tablet Take 20 mg by mouth daily.    Marland Kitchen omeprazole (PRILOSEC) 20 MG capsule Take 20 mg by mouth at bedtime. Reported on 03/26/2015    . ranitidine (ZANTAC) 300 MG tablet Take 300 mg by mouth at bedtime.    . sertraline (ZOLOFT) 50 MG tablet Take 50 mg by mouth at bedtime.    Marland Kitchen HYDROcodone-acetaminophen (NORCO) 10-325 MG tablet Take 1 tablet by mouth 2 (two) times daily.    Marland Kitchen HYDROcodone-acetaminophen (NORCO/VICODIN) 5-325 MG tablet Take 1 tablet by mouth every 6 (six) hours as needed for moderate pain. (Patient not taking: Reported on 11/16/2016) 70 tablet 0  . lisinopril (PRINIVIL,ZESTRIL) 20 MG tablet Take 20 mg by mouth daily before breakfast.     . Omega-3 Fatty Acids (FISH OIL) 1000 MG CAPS Take 1,000 mg by mouth daily.     . Probiotic Product (PROBIOTIC DAILY PO) Take 1 tablet by mouth daily.     . Red Yeast Rice Extract (RED YEAST RICE PO) Take 500 mg by mouth 2 (two) times daily.    . sodium chloride (OCEAN) 0.65 % SOLN nasal spray Place 1 spray into both nostrils at bedtime as needed for congestion.    Marland Kitchen tiZANidine (ZANAFLEX) 4 MG tablet Take 1 tablet (4 mg total) by mouth every 6 (six) hours as needed for muscle spasms. (Patient not taking: Reported on 11/16/2016) 60 tablet 0   No current facility-administered medications for this visit.     REVIEW OF SYSTEMS: see HPI for pertinent positives and negatives    Physical Examination  Vitals:   11/16/16 0922 11/16/16 0923  BP: (!) 144/78 (!) 146/79  Pulse: 63   Resp: 18   Temp: 97.8 F (36.6 C)   TempSrc: Oral   SpO2: 98%    Weight: 178 lb (80.7 kg)   Height:  (1.702 m)    Body mass index is 27.88 kg/m.   General: The patient appears their stated age.   HEENT:  No gross abnormalities. Pale conjunctiva.  Pulmonary: Respirations are non-labored, CTAB.  Abdomen: Soft and non-tender with normal pitched  bowel sounds. Musculoskeletal: There are no major deformities.   Neurologic: No focal weakness or paresthesias are detected. Muscle strength 4/5 un all extremities.  Skin: There are no ulcer or rashes noted. Facial pallor.  Psychiatric: The patient has normal affect. Cardiovascular: There is a regular rate and rhythm without significant murmur appreciated.   Vascular: Vessel Right Left  Radial 1+Palpable 1+Palpable  Brachial Palpable Palpable  Carotid  without bruit  without bruit  Aorta Not palpable N/A  Popliteal Not palpable Not palpable  PT 2+Palpable Not Palpable  DP 2+Palpable 2+Palpable     DATA  Renal Artery Duplex (Date: 11/16/16): Anechoic, non-vascularized cystic structure of right kidney measuring 2.1 cm in the lower pole. Cyst measured 2.6 cm on 10-22-14.   Anechoic, non vascularized cystic structure of left kidney measuring 2.4 cm in the upper pole. No evidence of significant right renal artery stenosis. Aneurysm of distal right renal artery is observed measuring 1.22 cm x 1.20 cm, unchanged from 10-22-2014.    Medical Decision Making  Laurie Atkinson is a 83 y.o. female who presents with no change in the size of her small right renal artery aneurysm, remains at 1.2 cm, same as in September 2016.  Size of the cyst in the right kidney is smaller (2.1 cm today compared to 2.6 cm on 10-22-14).  No mention of cyst in left kidney in 2016 (left side not documented in 2016), today measures 2.4 cm.   Bilateral renal cysts, measurements as above.   Based on the patient's vascular studies and examination, I have offered the patient: follow up in 2 years with bilateral renal artery  duplex.  Thank you for allowing [place] to participate in this patient's care.  NICKEL, Carma Lair, RN, MSN, FNP-C [facility] Office: [phone]  11/16/2016, 10:01 AM  Clinic MD:  Myra Gianotti

## 2016-11-19 DIAGNOSIS — D509 Iron deficiency anemia, unspecified: Secondary | ICD-10-CM | POA: Diagnosis not present

## 2016-12-22 ENCOUNTER — Other Ambulatory Visit: Payer: Medicare Other

## 2016-12-22 ENCOUNTER — Encounter: Payer: Self-pay | Admitting: Endocrinology

## 2016-12-22 ENCOUNTER — Ambulatory Visit: Payer: Medicare Other | Admitting: Endocrinology

## 2016-12-22 VITALS — BP 110/66 | HR 86 | Ht 67.0 in | Wt 176.6 lb

## 2016-12-22 DIAGNOSIS — E871 Hypo-osmolality and hyponatremia: Secondary | ICD-10-CM | POA: Diagnosis not present

## 2016-12-22 DIAGNOSIS — I951 Orthostatic hypotension: Secondary | ICD-10-CM | POA: Diagnosis not present

## 2016-12-22 NOTE — Progress Notes (Addendum)
Patient ID: Laurie Atkinson, female   DOB: 09-16-1933, 83 y.o.   MRN: [id number]   Reason for consultation: Low-sodium  Chief complaint: Weakness, lightheadedness and decreased appetite  History of Present Illness:   PROBLEM 1:  Patient has been referred for evaluation of hyponatremia However records from PCP are reflecting only recent tests and minimal history obtained from current records Most of the history is obtained from the patient and her family  She thinks that for several months patient has had periodic feelings of lightheadedness on standing up, fatigue, weakness and tendency to some weight loss When she feels worse she apparently has had a low sodium and this appears to be a recurrent problem was 7 months She has also had occasional trips to the emergency room because of worsening of symptoms  Recent sodium levels as follows: LOWEST sodium 104 done on 11/10/16 This improved up to 128 a week later and most recently sodium was 125 on 12/01/16 Previously in 9/18 her sodium was 132 Urinary sodium or osmolality studies are not available  Patient has been advised to restrict fluid but she finds it difficult because of persistent dry mouth and thirst   PROBLEM 2: HYPERCALCEMIA   Review of records show that she has had a high calcium since 01/2013 but previously records are not available  The hypercalcemia is not associated with any pathologic fractures, sarcoidosis, known carcinoma, thyroid disease She does have mild renal insufficiency with variable creatinine levels, occasionally in in the normal range.  She thinks she has had a kidney stone seen on x-ray  8-9 years ago but no recent problems with kidney stone She has not had any height loss or known vertebral fractures Although  bone density was recommended previously no records are available of this being done  Her hydrochlorothiazide has been stopped  in the past because of her hypercalcemia  Calcium  levelsPreviously  have been ranging from 10.2-11.6, most recently 10.9    Parathyroid hormone level  in 10/2016 was 66, previously 42 Most recent  25 (OH) Vitamin D level done in 9/16 was low at 16.5 but she has not been on any vitamin supplements recently Previously was  taking 50,000 units weekly, presumably for vitamin D deficiency  Lab Results  Component Value Date   CALCIUM 11.0 (H) 03/29/2015   CALCIUM 10.4 05/15/2013   CALCIUM 10.5 03/16/2013          Allergies as of 12/22/2016      Reactions   Morphine And Related Hives   Oxycodone Itching   Percocet [oxycodone-acetaminophen] Itching   Ambien [zolpidem] Anxiety      Medication List        Accurate as of 12/22/16  3:44 PM. Always use your most recent med list.          acetaminophen 500 MG tablet Commonly known as:  TYLENOL Take 500 mg by mouth as needed.   aspirin 81 MG tablet Take 81 mg by mouth daily.   CENTRUM ADULTS Tabs Take 1 tablet by mouth daily.   dimenhyDRINATE 50 MG tablet Commonly known as:  DRAMAMINE Take 50 mg by mouth as needed.   diphenhydrAMINE 25 mg capsule Commonly known as:  BENADRYL Take 25 mg by mouth as needed.   Fish Oil 1000 MG Caps Take 1,000 mg by mouth daily.   gabapentin 300 MG capsule Commonly known as:  NEURONTIN Take 300 mg by mouth 3 (three) times daily.   olmesartan 20 MG tablet Commonly  known as:  BENICAR Take 20 mg by mouth daily.   PROBIOTIC DAILY PO Take 1 tablet by mouth daily.   ranitidine 300 MG tablet Commonly known as:  ZANTAC Take 300 mg by mouth at bedtime.   RED YEAST RICE PO Take 500 mg by mouth 2 (two) times daily.   sertraline 50 MG tablet Commonly known as:  ZOLOFT Take 50 mg by mouth at bedtime.   sodium chloride 0.65 % Soln nasal spray Commonly known as:  OCEAN Place 1 spray into both nostrils at bedtime as needed for congestion.       Allergies:  Allergies  Allergen Reactions  . Morphine And Related Hives  . Oxycodone  Itching  . Percocet [Oxycodone-Acetaminophen] Itching  . Ambien [Zolpidem] Anxiety    Past Medical History:  Diagnosis Date  . Abnormality of gait   . Aneurysm of renal artery (HCC)   . Anxiety state, unspecified   . Arthritis    knees & neck   . Complication of anesthesia 1980's   decreased BP from medicine relative anesth. & her surgery got cancelled , but had surgery the next week, with no problems   . Depression   . Diplopia   . GERD (gastroesophageal reflux disease)   . H/O echocardiogram 2014   [facility] for ? indigestion, told that it was wnl  . Headache    distant history of  . History of blood transfusion    with colon surgery   . Hypercalcemia   . Hypertension   . Myocardial infarction (HCC) 1960's    ? when  . Other specified disorder of peritoneum   . Renal artery aneurysm [facility]) March 2015  . Unspecified disorder of kidney and ureter     Past Surgical History:  Procedure Laterality Date  . ABDOMINAL HYSTERECTOMY    . ANTERIOR CERVICAL DECOMP/DISCECTOMY FUSION N/A 04/05/2015   Procedure: Anterior Cervical Discectomy Decompression Fusion - Cervical three-Cervical four ;  Surgeon: Tia Alert, MD;  Location: [facility];  Service: Neurosurgery;  Laterality: N/A;  . APPENDECTOMY    . CARDIAC CATHETERIZATION  1960's?   told- no blockages   . CHOLECYSTECTOMY    . COLON SURGERY    . HAND SURGERY     thumb joint replacement   . HERNIA REPAIR  04/2012   Umbilical  . TONSILLECTOMY      Family History  Problem Relation Age of Onset  . Heart disease Mother        Heart Disease before age 24  . Stroke Mother        38  . Heart attack Mother   . Cancer Father        Throat  . Diabetes Father   . Heart attack Father   . Cancer Sister        Breast, Brain, Kidney  . Heart disease Brother        Pacemaker- Before age 26  . Heart attack Brother   . Diabetes Sister     Social History:  reports that  has never smoked. she has never used smokeless  tobacco. She reports that she does not drink alcohol or use drugs.    Review of Systems  Constitutional: Positive for weight loss and reduced appetite.       Symptoms present for about 2 months  HENT: Positive for trouble swallowing.        She has had on and off difficulty swallowing foods and liquids, several years ago  had esophageal dilatation.,  Recently has had more symptoms but has not had any evaluation  Respiratory: Negative for shortness of breath.   Cardiovascular: Negative for leg swelling.  Gastrointestinal: Positive for nausea and diarrhea. Negative for vomiting.       She has had chronic diarrhea since colon resection for ischemic bowel.  Has periodic nausea but no vomiting.  Has mild persistent lower abdominal discomfort  Endocrine: Positive for polydipsia and light-headedness.       She has increased thirst but not frequent urination.  No cold intolerance.  TSH was normal in 9/18 Periodically will feel lightheaded on standing up but has not passed out  Musculoskeletal: Positive for joint pain. Negative for back pain.  Skin: Negative for rash.  Neurological: Negative for weakness.  Psychiatric/Behavioral: Positive for depressed mood.       Her depression has been better with sertraline, has been taking this for several months but not clear when this was started   Weight history: She thinks she has lost weight recently  Wt Readings from Last 3 Encounters:  12/22/16 176 lb 9.6 oz (80.1 kg)  11/16/16 178 lb (80.7 kg)  04/05/15 176 lb 2.4 oz (79.9 kg)   REVIEW of SYSTEMS   History of hypertension, now treated with Benicar 20 mg Not on diuretics currently  Her last creatinine level was 0.93   EXAM:  BP 110/66   Pulse 86   Ht 5\' 7"  (1.702 m)   Wt 176 lb 9.6 oz (80.1 kg)   SpO2 93%   BMI 27.66 kg/m   STANDING blood pressure 102/54  Physical Exam  Constitutional: She is oriented to person, place, and time. She appears well-nourished.  HENT:  Oral mucosa and  tongue appear dry, tongue not coated  Eyes: Conjunctivae are normal.  Neck: No thyromegaly present.  Cardiovascular: Regular rhythm and normal heart sounds.  No murmur heard. Pulmonary/Chest: Breath sounds normal. She has no wheezes. She has no rales.  Normal percussion bilaterally  Abdominal: Soft. She exhibits no distension and no mass. There is no tenderness.  Genitourinary:  Genitourinary Comments: Exam not indicated  Musculoskeletal: She exhibits no edema, tenderness or deformity.  Lymphadenopathy:    She has no cervical adenopathy.  Neurological: She is alert and oriented to person, place, and time. She displays normal reflexes.  Skin: Skin is warm and dry. No pallor.  Psychiatric: She has a normal mood and affect.   Assessment:  HYPONATREMIA:    She does have symptomatic hyponatremia with levels as low as 124  Etiology of this is unclear but because of her associated symptoms including weight loss and orthostatic lightheadedness will need to consider adrenal insufficiency Other possibilities are  underlying malignancy, secondary hypothyroidism, SIADH from various causes including as a result of taking SSRI drugs like sertraline  HYPERCALCEMIA:  This is related to primary hyperparathyroidism with high parathyroid hormone level and persistent mild hypercalcemia for at least 4 years Not clear if she has any secondary effects like osteoporosis, needs evaluation of bone density Her calcium levels have been mildly increased and she is not a candidate for parathyroid surgery  Also with recent low vitamin D level needs to start back on supplementation   She is not a candidate for surgery because of not meeting the criteria and the calcium level being less than 11.5  Plan:   She will have urine osmolality checked today  Chest x-ray to be done  to rule out obvious malignancy, may consider CT scan also  Cortrosyn stimulation test to be done as soon as possible to rule out  adrenal insufficiency  Recommended that she be changed from SERTRALINE to a non-SSRI drug by PCP, and meanwhile can cut this down to half a tablet  Stop Benicar because of low-normal blood pressure and near syncopal symptoms  Will check free T4 to rule out secondary hypothyroidism also tomorrow and repeat sodium  Follow-up in 3 weeks and treatment to be decided on evaluation as above  Continue fluid restriction as possible   Jeslin Bazinet 12/22/2016, 3:44 PM   Labs as follows: She had SIADH, sodium 128 with urinary sodium above 20, negative Cortrosyn stimulation test and chest x-ray and free T4 not below normal She needs to stop sertraline and follow-up with PCP to consider workup for occult malignancy  Lab Results  Component Value Date   NA 128 (L) 12/28/2016   K 4.6 12/28/2016   CL 96 12/28/2016   CO2 26 12/28/2016

## 2016-12-22 NOTE — Patient Instructions (Addendum)
Sertraline 1/2   Stop Benicar

## 2016-12-23 ENCOUNTER — Other Ambulatory Visit: Payer: Self-pay | Admitting: Endocrinology

## 2016-12-23 DIAGNOSIS — E871 Hypo-osmolality and hyponatremia: Secondary | ICD-10-CM

## 2016-12-23 LAB — URINALYSIS, ROUTINE W REFLEX MICROSCOPIC
Bilirubin Urine: NEGATIVE
Hgb urine dipstick: NEGATIVE
Ketones, ur: NEGATIVE
NITRITE: NEGATIVE
PH: 6 (ref 5.0–8.0)
RBC / HPF: NONE SEEN (ref 0–?)
SPECIFIC GRAVITY, URINE: 1.02 (ref 1.000–1.030)
Total Protein, Urine: NEGATIVE
Urine Glucose: NEGATIVE
Urobilinogen, UA: 0.2 (ref 0.0–1.0)

## 2016-12-23 LAB — OSMOLALITY, URINE: Osmolality, Ur: 316 mOsm/kg (ref 50–1200)

## 2016-12-23 NOTE — Addendum Note (Signed)
Addended by: Adline MangoSTONE-ELMORE, Marvion Bastidas I on: 12/23/2016 09:01 AM   Modules accepted: Orders

## 2016-12-23 NOTE — Addendum Note (Signed)
Addended by: Adline MangoSTONE-ELMORE, Sonja Manseau I on: 12/23/2016 09:02 AM   Modules accepted: Orders

## 2016-12-24 LAB — SODIUM, URINE, RANDOM: SODIUM UR: 27 mmol/L

## 2016-12-28 ENCOUNTER — Telehealth: Payer: Self-pay

## 2016-12-28 ENCOUNTER — Ambulatory Visit (INDEPENDENT_AMBULATORY_CARE_PROVIDER_SITE_OTHER): Payer: Medicare Other

## 2016-12-28 ENCOUNTER — Other Ambulatory Visit: Payer: Self-pay | Admitting: Endocrinology

## 2016-12-28 ENCOUNTER — Other Ambulatory Visit (INDEPENDENT_AMBULATORY_CARE_PROVIDER_SITE_OTHER): Payer: Medicare Other

## 2016-12-28 ENCOUNTER — Ambulatory Visit
Admission: RE | Admit: 2016-12-28 | Discharge: 2016-12-28 | Disposition: A | Discharge status: Home / Self Care | Payer: Medicare Other | Source: Ambulatory Visit | Attending: Endocrinology | Admitting: Endocrinology

## 2016-12-28 DIAGNOSIS — E871 Hypo-osmolality and hyponatremia: Secondary | ICD-10-CM

## 2016-12-28 DIAGNOSIS — I951 Orthostatic hypotension: Secondary | ICD-10-CM

## 2016-12-28 DIAGNOSIS — E870 Hyperosmolality and hypernatremia: Secondary | ICD-10-CM

## 2016-12-28 LAB — CORTISOL
CORTISOL PLASMA: 22.6 ug/dL
Cortisol, Plasma: 10.5 ug/dL

## 2016-12-28 LAB — COMPREHENSIVE METABOLIC PANEL
ALBUMIN: 3.8 g/dL (ref 3.5–5.2)
ALK PHOS: 97 U/L (ref 39–117)
ALT: 17 U/L (ref 0–35)
AST: 20 U/L (ref 0–37)
BILIRUBIN TOTAL: 0.3 mg/dL (ref 0.2–1.2)
BUN: 10 mg/dL (ref 6–23)
CO2: 26 mEq/L (ref 19–32)
CREATININE: 0.91 mg/dL (ref 0.40–1.20)
Calcium: 10.8 mg/dL — ABNORMAL HIGH (ref 8.4–10.5)
Chloride: 96 mEq/L (ref 96–112)
GFR: 63.03 mL/min (ref >60.00)
Glucose, Bld: 87 mg/dL (ref 70–99)
POTASSIUM: 4.6 meq/L (ref 3.5–5.1)
Sodium: 128 mEq/L — ABNORMAL LOW (ref 135–145)
TOTAL PROTEIN: 6.9 g/dL (ref 6.0–8.3)

## 2016-12-28 LAB — T4, FREE: Free T4: 0.69 ng/dL (ref 0.60–1.60)

## 2016-12-28 MED ORDER — COSYNTROPIN NICU IV SYRINGE 0.25 MG/ML (STANDARD DOSE)
0.2500 mg | Freq: Once | INTRAVENOUS | Status: AC
  Administered 2016-12-31: 0.25 mg via INTRAMUSCULAR

## 2016-12-28 NOTE — Telephone Encounter (Signed)
Can you please add this diagnosis in the patient's chart so that I can associate thank you!!

## 2016-12-28 NOTE — Telephone Encounter (Signed)
Patient is here for cortisol test and I need to know what diagnosis to associate it with please advise

## 2016-12-28 NOTE — Telephone Encounter (Signed)
The diagnosis is already in my previous encounter.  You can also use hyponatremia from problem list

## 2016-12-28 NOTE — Telephone Encounter (Signed)
Orthostatic hypotension

## 2016-12-31 ENCOUNTER — Other Ambulatory Visit: Payer: Self-pay

## 2016-12-31 MED ORDER — LEVOTHYROXINE SODIUM 25 MCG PO CAPS: ORAL_CAPSULE | ORAL | 2 refills | Status: DC

## 2016-12-31 NOTE — Telephone Encounter (Signed)
This has been resolved

## 2017-01-14 ENCOUNTER — Other Ambulatory Visit: Payer: Self-pay | Admitting: Endocrinology

## 2017-01-14 DIAGNOSIS — E871 Hypo-osmolality and hyponatremia: Secondary | ICD-10-CM

## 2017-01-15 ENCOUNTER — Other Ambulatory Visit (INDEPENDENT_AMBULATORY_CARE_PROVIDER_SITE_OTHER): Payer: Medicare Other

## 2017-01-15 DIAGNOSIS — E871 Hypo-osmolality and hyponatremia: Secondary | ICD-10-CM | POA: Diagnosis not present

## 2017-01-15 LAB — BASIC METABOLIC PANEL
BUN: 14 mg/dL (ref 6–23)
CHLORIDE: 96 meq/L (ref 96–112)
CO2: 26 meq/L (ref 19–32)
CREATININE: 1.02 mg/dL (ref 0.40–1.20)
Calcium: 10.6 mg/dL — ABNORMAL HIGH (ref 8.4–10.5)
GFR: 55.25 mL/min — ABNORMAL LOW (ref >60.00)
Glucose, Bld: 84 mg/dL (ref 70–99)
POTASSIUM: 4.5 meq/L (ref 3.5–5.1)
SODIUM: 128 meq/L — AB (ref 135–145)

## 2017-01-15 LAB — TSH: TSH: 1.76 u[IU]/mL (ref 0.35–4.50)

## 2017-01-15 LAB — T4, FREE: FREE T4: 0.6 ng/dL (ref 0.60–1.60)

## 2017-01-19 ENCOUNTER — Ambulatory Visit: Payer: Medicare Other | Admitting: Endocrinology

## 2017-01-20 ENCOUNTER — Ambulatory Visit (INDEPENDENT_AMBULATORY_CARE_PROVIDER_SITE_OTHER): Payer: Medicare Other | Admitting: Endocrinology

## 2017-01-20 ENCOUNTER — Encounter: Payer: Self-pay | Admitting: Endocrinology

## 2017-01-20 VITALS — BP 110/68 | HR 67 | Ht 67.0 in | Wt 176.4 lb

## 2017-01-20 DIAGNOSIS — E038 Other specified hypothyroidism: Secondary | ICD-10-CM

## 2017-01-20 DIAGNOSIS — I951 Orthostatic hypotension: Secondary | ICD-10-CM | POA: Diagnosis not present

## 2017-01-20 DIAGNOSIS — E871 Hypo-osmolality and hyponatremia: Secondary | ICD-10-CM

## 2017-01-20 MED ORDER — LEVOTHYROXINE SODIUM 50 MCG PO TABS: 50.0000 ug | ORAL_TABLET | Freq: Every day | ORAL | 3 refills | Status: DC

## 2017-01-20 NOTE — Progress Notes (Signed)
Patient ID: Laurie NakayamaBillie F Schuur, female   DOB: 07/11/1933, 83 y.o.   MRN: [id number]   Reason for consultation: Low-sodium  Chief complaint: Weakness, lightheadedness and decreased appetite  History of Present Illness:   PROBLEM 1:  Patient has been referred for evaluation of hyponatremia However records from PCP are reflecting only recent tests and minimal history obtained from current records Most of the history is obtained from the patient and her family  She thinks that for several months patient has had periodic feelings of lightheadedness on standing up, fatigue, weakness and tendency to some weight loss When she feels worse she apparently has had a low sodium and this appears to be a recurrent problem was 7 months She has also had occasional trips to the emergency room because of worsening of symptoms  Previous sodium levels as follows: LOWEST sodium 104 done on 11/10/16 This improved up to 128 a week later and most recently sodium was 125 on 12/01/16 Previously in 9/18 her sodium was 132  RECENT results: Repeat sodium 128 with urinary sodium above 20, negative Cortrosyn stimulation test and chest x-ray and free T4 0.69, low normal She needs to stop sertraline and follow-up with PCP to consider workup for occult malignancy such as CT scan of the chest Patient has been advised to restrict fluid but she finds it difficult because of persistent dry mouth and thirst  She is here for follow-up but apparently has not been seen or called by her PCP for recommendations as above She also did not follow any instructions as far as stopping Benicar and reducing or tapering off her sertraline   Sodium is still low at 128, same as last month However she does not complain of drowsiness, persistent nausea or any change in weakness, may feel a little less tired recently  Lab Results  Component Value Date   NA 128 (L) 01/15/2017   K 4.5 01/15/2017   CL 96 01/15/2017   CO2 26  01/15/2017    PROBLEM 2: HYPERCALCEMIA   Review of records show that she has had a high calcium since 01/2013 but previously records are not available  The hypercalcemia is not associated with any pathologic fractures, sarcoidosis, known carcinoma, thyroid disease She does have mild renal insufficiency with variable creatinine levels, occasionally in in the normal range.  She thinks she has had a kidney stone seen on x-ray  8-9 years ago but no recent problems with kidney stone She has not had any height loss or known vertebral fractures Although  bone density was recommended previously no records are available of this being done  Her hydrochlorothiazide has been stopped  in the past because of her hypercalcemia  Calcium levels: Previously  have been ranging from 10.2-11.6, most recently 10.6    Parathyroid hormone level  in 10/2016 was 66, previously 42 Most recent  25 (OH) Vitamin D level done in 1/18 was low at 16.5 but she has not been on any vitamin supplements recently Previously was  taking 50,000 units weekly, presumably for vitamin D deficiency  Lab Results  Component Value Date   CALCIUM 10.6 (H) 01/15/2017   CALCIUM 10.8 (H) 12/28/2016   CALCIUM 11.0 (H) 03/29/2015        Allergies as of 01/20/2017      Reactions   Morphine And Related Hives   Oxycodone Itching   Percocet [oxycodone-acetaminophen] Itching   Ambien [zolpidem] Anxiety      Medication List  Accurate as of 01/20/17  4:19 PM. Always use your most recent med list.          acetaminophen 500 MG tablet Commonly known as:  TYLENOL Take 500 mg by mouth as needed.   aspirin 81 MG tablet Take 81 mg by mouth daily.   CENTRUM ADULTS Tabs Take 1 tablet by mouth daily.   dimenhyDRINATE 50 MG tablet Commonly known as:  DRAMAMINE Take 50 mg by mouth as needed.   diphenhydrAMINE 25 mg capsule Commonly known as:  BENADRYL Take 25 mg by mouth as needed.   Fish Oil 1000 MG Caps Take 1,000 mg  by mouth daily.   gabapentin 300 MG capsule Commonly known as:  NEURONTIN Take 300 mg by mouth 3 (three) times daily.   Levothyroxine Sodium 25 MCG Caps Take 1 daily before breakfast   olmesartan 20 MG tablet Commonly known as:  BENICAR Take 20 mg by mouth daily.   omeprazole 20 MG capsule Commonly known as:  PRILOSEC Take 20 mg by mouth.   PROBIOTIC DAILY PO Take 1 tablet by mouth daily.   ranitidine 300 MG tablet Commonly known as:  ZANTAC Take 300 mg by mouth at bedtime.   RED YEAST RICE PO Take 500 mg by mouth 2 (two) times daily.   sertraline 50 MG tablet Commonly known as:  ZOLOFT Take 50 mg by mouth at bedtime.   sodium chloride 0.65 % Soln nasal spray Commonly known as:  OCEAN Place 1 spray into both nostrils at bedtime as needed for congestion.       Allergies:  Allergies  Allergen Reactions  . Morphine And Related Hives  . Oxycodone Itching  . Percocet [Oxycodone-Acetaminophen] Itching  . Ambien [Zolpidem] Anxiety    Past Medical History:  Diagnosis Date  . Abnormality of gait   . Aneurysm of renal artery (HCC)   . Anxiety state, unspecified   . Arthritis    knees & neck   . Complication of anesthesia 1980's   decreased BP from medicine relative anesth. & her surgery got cancelled , but had surgery the next week, with no problems   . Depression   . Diplopia   . GERD (gastroesophageal reflux disease)   . H/O echocardiogram 2014   [facility] for ? indigestion, told that it was wnl  . Headache    distant history of  . History of blood transfusion    with colon surgery   . Hypercalcemia   . Hypertension   . Myocardial infarction (HCC) 1960's    ? when  . Other specified disorder of peritoneum   . Renal artery aneurysm [facility](HCC) March 2015  . Unspecified disorder of kidney and ureter     Past Surgical History:  Procedure Laterality Date  . ABDOMINAL HYSTERECTOMY    . ANTERIOR CERVICAL DECOMP/DISCECTOMY FUSION N/A 04/05/2015   Procedure:  Anterior Cervical Discectomy Decompression Fusion - Cervical three-Cervical four ;  Surgeon: Tia Alertavid S Jones, MD;  Location: [facility];  Service: Neurosurgery;  Laterality: N/A;  . APPENDECTOMY    . CARDIAC CATHETERIZATION  1960's?   told- no blockages   . CHOLECYSTECTOMY    . COLON SURGERY    . HAND SURGERY     thumb joint replacement   . HERNIA REPAIR  04/2012   Umbilical  . TONSILLECTOMY      Family History  Problem Relation Age of Onset  . Heart disease Mother        Heart Disease before age  60  . Stroke Mother        21  . Heart attack Mother   . Cancer Father        Throat  . Diabetes Father   . Heart attack Father   . Cancer Sister        Breast, Brain, Kidney  . Heart disease Brother        Pacemaker- Before age 73  . Heart attack Brother   . Diabetes Sister     Social History:  reports that  has never smoked. she has never used smokeless tobacco. She reports that she does not drink alcohol or use drugs.    Review of Systems  Constitutional: Positive for reduced appetite.  Gastrointestinal: Positive for nausea.  Neurological: Positive for balance difficulty.      Wt Readings from Last 3 Encounters:  01/20/17 176 lb 6.4 oz (80 kg)  12/22/16 176 lb 9.6 oz (80.1 kg)  11/16/16 178 lb (80.7 kg)   REVIEW of SYSTEMS   History of hypertension, now treated with Benicar 20 mg On her initial consultation she was told to reduce this to half a tablet and possibly stop because of syncope She still has feelings of weakness and dizziness and needing to sit down because of symptoms but no syncope Blood pressure at home 112/68 recently  Not on diuretics currently   Physical Exam  BP 110/68   Pulse 67   Ht 5\' 7"  (1.702 m)   Wt 176 lb 6.4 oz (80 kg)   SpO2 96%   BMI 27.63 kg/m   Mild mucosal dryness of the month Repeat blood pressure standing is 120/68  Assessment:  HYPONATREMIA:    She does have recurrent and symptomatic hyponatremia with levels as low  as 124 Currently without any specific intervention her sodium is slightly better at 128 Etiology is still unclear but with urine osmolality being high she does have SIADH  Recommendations made on the last visit have not been implemented: She was supposed to reduce sertraline, consider CT scan of the chest with her PCP She is trying to restrict fluids like water somewhat but find it difficult to do because of dry mouth  She does not appear to be symptomatic however  ?  SECONDARY HYPOTHYROIDISM:  Her free T4 has been low normal and even with taking 25 g of levothyroxine her free T4 is still relatively low at 0.6 TSH has not been high  She does feel a little less fatigue now with 25 g supplement   Orthostatic hypotension: She has mild orthostasis but has significant symptoms at home Considering her age and symptoms she really needs to be on minimal amount of antihypertensives and possibly none  Plan:   She will stop sertraline and discuss alternatives with PCP, does not need to be on an SSRI because of possible SIADH from this  She can supplement salt in her diet  INCREASE levothyroxine to 50 g  Although her sodium issue has been recurrent and not new she does appear to have problems with decreased appetite and history of weight loss, may well need CT scan of the chest and will defer to PCP  Continue fluid restriction as much as possible  Recommend that she take only half Benicar daily and if blood pressure is still low normal to consider stopping this and following up with PCP   Patient instructions were handed to the patient and reviewed in detail for improved compliance  Patient Instructions  Olmesartan 1/2  daily  Stop SERTRALINE and call Dr Jeanie Sewer about CT scan chest  LEVEOTHROXINE 2 OF 25UG daily   Total visit time for evaluation and management of multiple problems and counseling = 25 minutes  Reather Littler 01/20/2017, 4:19 PM

## 2017-01-20 NOTE — Patient Instructions (Addendum)
Olmesartan 1/2 daily  Stop SERTRALINE and call Dr Jeanie Seweredding about CT scan chest  LEVEOTHROXINE 2 OF 25UG daily

## 2017-02-05 ENCOUNTER — Telehealth: Payer: Self-pay | Admitting: Endocrinology

## 2017-02-05 NOTE — Telephone Encounter (Signed)
Dr Redding# [phone]  MD has sent a CT scan of chest as requested the iodine was not able to be done.

## 2017-02-05 NOTE — Telephone Encounter (Signed)
Dr. Jeanie Seweredding is calling to let Dr.Kumar know that the CT was done but the contrast was not able to be completed. Dr. Jeanie Seweredding is requesting a call from Lucianne MussKumar if this contrast is absolutely needed for this patient.

## 2017-02-05 NOTE — Telephone Encounter (Signed)
Please let his office know that contrast is not necessary as long as the radiologist can rule out tumor with the current CT scan

## 2017-02-10 NOTE — Telephone Encounter (Signed)
Spoke with Dr. Jeanie Seweredding and he stated the radiologist can not rule out malignancy without CT with contrast so he will work on getting that done for this patient

## 2017-02-18 ENCOUNTER — Ambulatory Visit: Payer: Medicare Other | Admitting: Endocrinology

## 2017-02-18 ENCOUNTER — Encounter: Payer: Self-pay | Admitting: Endocrinology

## 2017-02-18 VITALS — BP 134/88 | HR 66 | Temp 97.8°F | Wt 182.2 lb

## 2017-02-18 DIAGNOSIS — E038 Other specified hypothyroidism: Secondary | ICD-10-CM

## 2017-02-18 DIAGNOSIS — E871 Hypo-osmolality and hyponatremia: Secondary | ICD-10-CM | POA: Diagnosis not present

## 2017-02-18 LAB — COMPREHENSIVE METABOLIC PANEL
ALT: 13 U/L (ref 0–35)
AST: 19 U/L (ref 0–37)
Albumin: 4.2 g/dL (ref 3.5–5.2)
Alkaline Phosphatase: 105 U/L (ref 39–117)
BUN: 17 mg/dL (ref 6–23)
CHLORIDE: 103 meq/L (ref 96–112)
CO2: 27 mEq/L (ref 19–32)
Calcium: 10.7 mg/dL — ABNORMAL HIGH (ref 8.4–10.5)
Creatinine, Ser: 0.93 mg/dL (ref 0.40–1.20)
GFR: 61.45 mL/min (ref >60.00)
GLUCOSE: 78 mg/dL (ref 70–99)
POTASSIUM: 4.3 meq/L (ref 3.5–5.1)
Sodium: 137 mEq/L (ref 135–145)
Total Bilirubin: 0.3 mg/dL (ref 0.2–1.2)
Total Protein: 7 g/dL (ref 6.0–8.3)

## 2017-02-18 LAB — T4, FREE: Free T4: 0.89 ng/dL (ref 0.60–1.60)

## 2017-02-18 LAB — TSH: TSH: 0.42 u[IU]/mL (ref 0.35–4.50)

## 2017-02-18 NOTE — Progress Notes (Addendum)
Patient ID: Laurie Atkinson, female   DOB: 08-01-1933, 84 y.o.   MRN: [id number]    Chief complaint: Follow-up of thyroid and low-sodium  History of Present Illness:   PROBLEM 1:  Baseline history: She was referred for evaluation of hyponatremia in 12/2016 However records from PCP are reflecting only recent tests and minimal history obtained from records Most of the history is obtained from the patient and her family  She thinks that for several months patient has had periodic feelings of lightheadedness on standing up, fatigue, weakness and tendency to some weight loss When she feels worse she apparently has had a low sodium and this appears to be a recurrent problem for the prior 7 months She has also had occasional trips to the emergency room because of worsening of symptoms  Previous sodium levels as follows: LOWEST sodium 124 done on 11/10/16 This improved up to 128 a week later and most recently sodium was 125 on 12/01/16 Previously in 9/18 her sodium was 132  LAB results: Urinary sodium above 20, negative Cortrosyn stimulation test and chest x-ray and free T4 0.69, low normal CT scan of the chest, report not available but apparently unable to do with contrast  Sodium is in 12/18 was still low at 128, same as the last month  RECENT history: She was told to stop her sertraline on her last visit which she has done She tries to restrict fluids as much as possible Subjectively the patient recently is feeling a better appetite, less nausea, feels less weak and not as tired She has gained back some weight   Lab Results  Component Value Date   NA 128 (L) 01/15/2017   K 4.5 01/15/2017   CL 96 01/15/2017   CO2 26 01/15/2017    PROBLEM 2:   HYPOTHYROIDISM, secondary:   On her initial evaluation she was found to have a low normal free T4 level Because of her symptoms of fatigue, weakness and hyponatremia she is on thyroxine supplementation Initially with 25 g  levothyroxine and her symptoms did not improve much and her free T4 was lower She is not taking 50 g daily She says her fatigue and gait imbalance is better No new cold intolerance recently  Lab Results  Component Value Date   TSH 1.76 01/15/2017   FREET4 0.60 01/15/2017   FREET4 0.69 12/28/2016     PROBLEM 3: HYPERCALCEMIA   Review of records show that she has had a high calcium since 01/2013 but previously records are not available  The hypercalcemia is not associated with any pathologic fractures, sarcoidosis, known carcinoma, thyroid disease She does have mild renal insufficiency with variable creatinine levels, occasionally in in the normal range.  She thinks she has had a kidney stone seen on x-ray  8-9 years ago but no recent problems with kidney stone She has not had any height loss or known vertebral fractures Although  bone density was recommended previously no records are available of this being done  Her hydrochlorothiazide has been stopped  in the past because of her hypercalcemia  Calcium levels: Previously  have been ranging from 10.2-11.6, most recently 10.6    Parathyroid hormone level  in 10/2016 was 66, previously 42 Most recent  25 (OH) Vitamin D level done in 1/18 was low at 16.5 but she has not been on any vitamin supplements recently Previously was  taking 50,000 units weekly, presumably for vitamin D deficiency  Lab Results  Component Value Date  CALCIUM 10.6 (H) 01/15/2017   CALCIUM 10.8 (H) 12/28/2016   CALCIUM 11.0 (H) 03/29/2015        Allergies as of 02/18/2017      Reactions   Morphine And Related Hives   Oxycodone Itching   Percocet [oxycodone-acetaminophen] Itching   Ambien [zolpidem] Anxiety      Medication List        Accurate as of 02/18/17 11:29 AM. Always use your most recent med list.          acetaminophen 500 MG tablet Commonly known as:  TYLENOL Take 500 mg by mouth as needed.   aspirin 81 MG tablet Take 81 mg by  mouth daily.   CENTRUM ADULTS Tabs Take 1 tablet by mouth daily.   dimenhyDRINATE 50 MG tablet Commonly known as:  DRAMAMINE Take 50 mg by mouth as needed.   diphenhydrAMINE 25 mg capsule Commonly known as:  BENADRYL Take 25 mg by mouth as needed.   Fish Oil 1000 MG Caps Take 1,000 mg by mouth daily.   gabapentin 300 MG capsule Commonly known as:  NEURONTIN Take 300 mg by mouth 3 (three) times daily.   levothyroxine 50 MCG tablet Commonly known as:  SYNTHROID, LEVOTHROID Take 1 tablet (50 mcg total) by mouth daily.   olmesartan 20 MG tablet Commonly known as:  BENICAR Take 20 mg by mouth daily.   omeprazole 20 MG capsule Commonly known as:  PRILOSEC Take 20 mg by mouth.   PROBIOTIC DAILY PO Take 1 tablet by mouth daily.   ranitidine 300 MG tablet Commonly known as:  ZANTAC Take 300 mg by mouth at bedtime.   RED YEAST RICE PO Take 500 mg by mouth 2 (two) times daily.   sertraline 50 MG tablet Commonly known as:  ZOLOFT Take 50 mg by mouth at bedtime.   sodium chloride 0.65 % Soln nasal spray Commonly known as:  OCEAN Place 1 spray into both nostrils at bedtime as needed for congestion.       Allergies:  Allergies  Allergen Reactions  . Morphine And Related Hives  . Oxycodone Itching  . Percocet [Oxycodone-Acetaminophen] Itching  . Ambien [Zolpidem] Anxiety    Past Medical History:  Diagnosis Date  . Abnormality of gait   . Aneurysm of renal artery (HCC)   . Anxiety state, unspecified   . Arthritis    knees & neck   . Complication of anesthesia 1980's   decreased BP from medicine relative anesth. & her surgery got cancelled , but had surgery the next week, with no problems   . Depression   . Diplopia   . GERD (gastroesophageal reflux disease)   . H/O echocardiogram 2014   [facility] for ? indigestion, told that it was wnl  . Headache    distant history of  . History of blood transfusion    with colon surgery   . Hypercalcemia   .  Hypertension   . Myocardial infarction (HCC) 1960's    ? when  . Other specified disorder of peritoneum   . Renal artery aneurysm [facility]) March 2015  . Unspecified disorder of kidney and ureter     Past Surgical History:  Procedure Laterality Date  . ABDOMINAL HYSTERECTOMY    . ANTERIOR CERVICAL DECOMP/DISCECTOMY FUSION N/A 04/05/2015   Procedure: Anterior Cervical Discectomy Decompression Fusion - Cervical three-Cervical four ;  Surgeon: Tia Alert, MD;  Location: [facility];  Service: Neurosurgery;  Laterality: N/A;  . APPENDECTOMY    .  CARDIAC CATHETERIZATION  1960's?   told- no blockages   . CHOLECYSTECTOMY    . COLON SURGERY    . HAND SURGERY     thumb joint replacement   . HERNIA REPAIR  04/2012   Umbilical  . TONSILLECTOMY      Family History  Problem Relation Age of Onset  . Heart disease Mother        Heart Disease before age 82  . Stroke Mother        [id number]  . Heart attack Mother   . Cancer Father        Throat  . Diabetes Father   . Heart attack Father   . Cancer Sister        Breast, Brain, Kidney  . Heart disease Brother        Pacemaker- Before age 82  . Heart attack Brother   . Diabetes Sister     Social History:  reports that  has never smoked. she has never used smokeless tobacco. She reports that she does not drink alcohol or use drugs.    Review of Systems  Constitutional: Positive for reduced appetite.  Gastrointestinal: Positive for nausea.  Neurological: Positive for balance difficulty.      Wt Readings from Last 3 Encounters:  02/18/17 182 lb 4 oz (82.7 kg)  01/20/17 176 lb 6.4 oz (80 kg)  12/22/16 176 lb 9.6 oz (80.1 kg)   REVIEW of SYSTEMS   History of hypertension, now off medication, previously on Benicar She says she checks her blood pressure daily in the last reading was 15/70    Physical Exam  BP 134/88 (BP Location: Left Arm, Patient Position: Sitting, Cuff Size: Normal)   Pulse 66   Temp 97.8 F (36.6 C) (Oral)    Wt 182 lb 4 oz (82.7 kg)   SpO2 92%   BMI 28.54 kg/m   Mild mucosal dryness of the tongue and oral cavity  Repeat blood pressure standing is 130/82  Assessment:  HYPONATREMIA:    She has a history of recurrent and symptomatic hyponatremia with levels as low as 124 Etiology is still unclear but with urine osmolality being high she does have SIADH  Recommendations made on the last visit: She has stopped sertraline, CT scan of chest could not be completed but apparently no obvious lesion pounds She is trying to restrict fluids like water   She does not appear to be as symptomatic however with improved appetite and weight  SECONDARY HYPOTHYROIDISM:  Her free T4 has been low normal at baseline and the dose was increased up to 50 g on her last visit in December TSH has not been high  She does feel less fatigued now with 50 g dosage  Plan:   She will have reassessment of her electrolytes and thyroid functions  She will continue to work with her PCP for her blood pressure and monitor at home also  Follow-up to be decided  Recheck calcium level also    There are no Patient Instructions on file for this visit.  Total visit time for evaluation and management of multiple problems and counseling = 25 minutes  Adler Chartrand 02/18/2017, 11:29 AM      Sodium 137, free T4 normal, she will continue levothyroxine unchanged and follow-up in 3 months Does not need to restrict fluids as strictly now

## 2017-02-19 ENCOUNTER — Telehealth: Payer: Self-pay | Admitting: Endocrinology

## 2017-02-19 NOTE — Telephone Encounter (Signed)
Brooke from [facility] called-please call her at ph#[phone] re: catscan-needs to give you information

## 2017-02-19 NOTE — Progress Notes (Signed)
Would like to see her back in follow-up in 3 months for thyroid, same day lab Please call to let patient know that the sodium and thyroid results are normal and no change needed

## 2017-02-24 NOTE — Telephone Encounter (Signed)
Left vm with Brooke requesting call back to discuss note below

## 2017-02-25 NOTE — Telephone Encounter (Signed)
Brooke from[facility] is returning your call [phone]  Ex [phone]

## 2017-03-04 DIAGNOSIS — K259 Gastric ulcer, unspecified as acute or chronic, without hemorrhage or perforation: Secondary | ICD-10-CM | POA: Diagnosis not present

## 2017-03-04 DIAGNOSIS — K648 Other hemorrhoids: Secondary | ICD-10-CM | POA: Diagnosis not present

## 2017-03-04 DIAGNOSIS — D509 Iron deficiency anemia, unspecified: Secondary | ICD-10-CM | POA: Diagnosis not present

## 2017-03-05 NOTE — Telephone Encounter (Signed)
Left Brooke vm requesting call back

## 2017-04-07 HISTORY — PX: ESOPHAGOGASTRODUODENOSCOPY: SHX1529

## 2017-06-03 ENCOUNTER — Other Ambulatory Visit: Payer: Self-pay | Admitting: Endocrinology

## 2017-06-14 ENCOUNTER — Encounter: Payer: Self-pay | Admitting: *Deleted

## 2017-06-16 ENCOUNTER — Other Ambulatory Visit: Payer: Self-pay

## 2017-06-16 ENCOUNTER — Encounter
Admission: RE | Disposition: A | Discharge status: Home / Self Care | Payer: Self-pay | Source: Ambulatory Visit | Attending: Ophthalmology

## 2017-06-16 ENCOUNTER — Encounter: Payer: Self-pay | Admitting: Certified Registered Nurse Anesthetist

## 2017-06-16 ENCOUNTER — Ambulatory Visit
Admission: RE | Admit: 2017-06-16 | Discharge: 2017-06-16 | Disposition: A | Discharge status: Home / Self Care | Payer: Medicare Other | Source: Ambulatory Visit | Attending: Ophthalmology | Admitting: Ophthalmology

## 2017-06-16 ENCOUNTER — Ambulatory Visit: Payer: Medicare Other | Admitting: Certified Registered Nurse Anesthetist

## 2017-06-16 DIAGNOSIS — I2510 Atherosclerotic heart disease of native coronary artery without angina pectoris: Secondary | ICD-10-CM | POA: Diagnosis not present

## 2017-06-16 DIAGNOSIS — Z79899 Other long term (current) drug therapy: Secondary | ICD-10-CM | POA: Diagnosis not present

## 2017-06-16 DIAGNOSIS — Z79890 Hormone replacement therapy (postmenopausal): Secondary | ICD-10-CM | POA: Insufficient documentation

## 2017-06-16 DIAGNOSIS — K219 Gastro-esophageal reflux disease without esophagitis: Secondary | ICD-10-CM | POA: Insufficient documentation

## 2017-06-16 DIAGNOSIS — H2512 Age-related nuclear cataract, left eye: Secondary | ICD-10-CM | POA: Insufficient documentation

## 2017-06-16 DIAGNOSIS — I252 Old myocardial infarction: Secondary | ICD-10-CM | POA: Insufficient documentation

## 2017-06-16 DIAGNOSIS — I739 Peripheral vascular disease, unspecified: Secondary | ICD-10-CM | POA: Insufficient documentation

## 2017-06-16 DIAGNOSIS — I110 Hypertensive heart disease with heart failure: Secondary | ICD-10-CM | POA: Insufficient documentation

## 2017-06-16 DIAGNOSIS — E039 Hypothyroidism, unspecified: Secondary | ICD-10-CM | POA: Diagnosis not present

## 2017-06-16 HISTORY — PX: CATARACT EXTRACTION W/PHACO: SHX586

## 2017-06-16 HISTORY — DX: Atherosclerotic heart disease of native coronary artery without angina pectoris: I25.10

## 2017-06-16 HISTORY — DX: Hypothyroidism, unspecified: E03.9

## 2017-06-16 HISTORY — DX: Polyneuropathy, unspecified: G62.9

## 2017-06-16 SURGERY — PHACOEMULSIFICATION, CATARACT, WITH IOL INSERTION
Anesthesia: Monitor Anesthesia Care | Site: Eye | Laterality: Left | Wound class: Clean

## 2017-06-16 MED ORDER — POVIDONE-IODINE 5 % OP SOLN
OPHTHALMIC | Status: DC | PRN
  Administered 2017-06-16: 1 via OPHTHALMIC

## 2017-06-16 MED ORDER — MOXIFLOXACIN HCL 0.5 % OP SOLN
OPHTHALMIC | Status: DC | PRN
  Administered 2017-06-16: .2 mL via OPHTHALMIC

## 2017-06-16 MED ORDER — SODIUM CHLORIDE 0.9 % IV SOLN
INTRAVENOUS | Status: DC
  Administered 2017-06-16: 09:00:00 via INTRAVENOUS

## 2017-06-16 MED ORDER — NA CHONDROIT SULF-NA HYALURON 40-17 MG/ML IO SOLN
INTRAOCULAR | Status: DC | PRN
  Administered 2017-06-16: 1 mL via INTRAOCULAR

## 2017-06-16 MED ORDER — CARBACHOL 0.01 % IO SOLN
INTRAOCULAR | Status: DC | PRN
  Administered 2017-06-16: .5 mL via INTRAOCULAR

## 2017-06-16 MED ORDER — EPINEPHRINE PF 1 MG/ML IJ SOLN
INTRAMUSCULAR | Status: DC | PRN
  Administered 2017-06-16: 1 mL via OPHTHALMIC

## 2017-06-16 MED ORDER — BSS IO SOLN
INTRAOCULAR | Status: DC | PRN
  Administered 2017-06-16: 2 mL via OPHTHALMIC

## 2017-06-16 MED ORDER — MIDAZOLAM HCL 2 MG/2ML IJ SOLN
INTRAMUSCULAR | Status: DC | PRN
  Administered 2017-06-16: 1 mg via INTRAVENOUS

## 2017-06-16 MED ORDER — ARMC OPHTHALMIC DILATING DROPS
1.0000 "application " | OPHTHALMIC | Status: AC
  Administered 2017-06-16 (×3): 1 via OPHTHALMIC

## 2017-06-16 MED ORDER — MIDAZOLAM HCL 2 MG/2ML IJ SOLN
INTRAMUSCULAR | Status: AC
  Filled 2017-06-16: qty 2

## 2017-06-16 MED ORDER — MOXIFLOXACIN HCL 0.5 % OP SOLN: 1.0000 [drp] | OPHTHALMIC | Status: DC | PRN

## 2017-06-16 SURGICAL SUPPLY — 16 items
GLOVE BIO SURGEON STRL SZ8 (GLOVE) ×3 IMPLANT
GLOVE BIOGEL M 6.5 STRL (GLOVE) ×3 IMPLANT
GLOVE SURG LX 8.0 MICRO (GLOVE) ×2
GLOVE SURG LX STRL 8.0 MICRO (GLOVE) ×1 IMPLANT
GOWN STRL REUS W/ TWL LRG LVL3 (GOWN DISPOSABLE) ×2 IMPLANT
GOWN STRL REUS W/TWL LRG LVL3 (GOWN DISPOSABLE) ×6
LABEL CATARACT MEDS ST (LABEL) ×3 IMPLANT
LENS IOL TECNIS ITEC 23.0 (Intraocular Lens) ×2 IMPLANT
PACK CATARACT (MISCELLANEOUS) ×3 IMPLANT
PACK CATARACT BRASINGTON LX (MISCELLANEOUS) ×3 IMPLANT
PACK EYE AFTER SURG (MISCELLANEOUS) ×3 IMPLANT
SOL BSS BAG (MISCELLANEOUS) ×3
SOLUTION BSS BAG (MISCELLANEOUS) ×1 IMPLANT
SYR 5ML LL (SYRINGE) ×3 IMPLANT
WATER STERILE IRR 250ML POUR (IV SOLUTION) ×3 IMPLANT
WIPE NON LINTING 3.25X3.25 (MISCELLANEOUS) ×3 IMPLANT

## 2017-06-16 NOTE — Anesthesia Post-op Follow-up Note (Signed)
Anesthesia QCDR form completed.        

## 2017-06-16 NOTE — Anesthesia Preprocedure Evaluation (Signed)
Anesthesia Evaluation  Patient identified by MRN, date of birth, ID band Patient awake    Reviewed: Allergy & Precautions, H&P , NPO status , reviewed documented beta blocker date and time   History of Anesthesia Complications (+) history of anesthetic complications  Airway Mallampati: II  TM Distance: >3 FB Neck ROM: full    Dental  (+) Partial Upper, Partial Lower   Pulmonary    Pulmonary exam normal        Cardiovascular hypertension, + CAD, + Past MI and + Peripheral Vascular Disease  Normal cardiovascular exam     Neuro/Psych  Headaches, PSYCHIATRIC DISORDERS Anxiety Depression    GI/Hepatic hiatal hernia, GERD  Medicated and Controlled,  Endo/Other  Hypothyroidism   Renal/GU Renal disease     Musculoskeletal  (+) Arthritis ,   Abdominal   Peds  Hematology   Anesthesia Other Findings Past Medical History: No date: Abnormality of gait No date: Aneurysm of renal artery (HCC) No date: Anxiety state, unspecified No date: Arthritis     Comment:  knees & neck  5956'L: Complication of anesthesia     Comment:  decreased BP from medicine relative anesth. & her               surgery got cancelled , but had surgery the next week,               with no problems  No date: Coronary artery disease No date: Depression No date: Diplopia No date: GERD (gastroesophageal reflux disease)     Comment:  HIATAL HERNIA 2014: H/O echocardiogram     Comment:  [facility] for ? indigestion, told that it was wnl No date: Headache     Comment:  distant history of No date: History of blood transfusion     Comment:  with colon surgery  No date: Hypercalcemia No date: Hypertension No date: Hypothyroidism 1960's : Myocardial infarction ([place])     Comment:  ? when No date: Neuropathy No date: Other specified disorder of peritoneum March 2015: Renal artery aneurysm ([place]) No date: Unspecified disorder of kidney and  ureter  Past Surgical History: No date: ABDOMINAL HYSTERECTOMY 04/05/2015: ANTERIOR CERVICAL DECOMP/DISCECTOMY FUSION; N/A     Comment:  Procedure: Anterior Cervical Discectomy Decompression               Fusion - Cervical three-Cervical four ;  Surgeon: Eustace Moore, MD;  Location: [place] [facility];  Service:               Neurosurgery;  Laterality: N/A; No date: APPENDECTOMY No date: BACK SURGERY 1960's?: CARDIAC CATHETERIZATION     Comment:  told- no blockages  No date: CHOLECYSTECTOMY No date: COLON SURGERY No date: HAND SURGERY     Comment:  thumb joint replacement  04/2012: HERNIA REPAIR     Comment:  Umbilical/ HIATAL No date: TONSILLECTOMY  BMI    Body Mass Index:  28.66 kg/m      Reproductive/Obstetrics                             Anesthesia Physical Anesthesia Plan  ASA: III  Anesthesia Plan: MAC   Post-op Pain Management:    Induction:   PONV Risk Score and Plan: 2 and TIVA  Airway Management Planned:   Additional Equipment:   Intra-op Plan:   Post-operative Plan:  Informed Consent: I have reviewed the patients History and Physical, chart, labs and discussed the procedure including the risks, benefits and alternatives for the proposed anesthesia with the patient or authorized representative who has indicated his/her understanding and acceptance.   Dental Advisory Given  Plan Discussed with: CRNA  Anesthesia Plan Comments:         Anesthesia Quick Evaluation

## 2017-06-16 NOTE — Anesthesia Postprocedure Evaluation (Signed)
Anesthesia Post Note  Patient: Laurie Atkinson  Procedure(s) Performed: CATARACT EXTRACTION PHACO AND INTRAOCULAR LENS PLACEMENT (IOC) (Left Eye)  Patient location during evaluation: Other Anesthesia Type: MAC Level of consciousness: awake and alert Pain management: pain level controlled Vital Signs Assessment: post-procedure vital signs reviewed and stable Respiratory status: spontaneous breathing, nonlabored ventilation and respiratory function stable Cardiovascular status: blood pressure returned to baseline and stable Postop Assessment: no apparent nausea or vomiting Anesthetic complications: no     Last Vitals:  Vitals:   06/16/17 0958 06/16/17 1005  BP: (!) 136/58 (!) 151/63  Pulse: 62 62  Resp: 18 18  Temp: 36.6 C   SpO2: 97% 99%    Last Pain:  Vitals:   06/16/17 1005  TempSrc:   PainSc: 0-No pain                 Alphonsus Sias

## 2017-06-16 NOTE — Discharge Instructions (Addendum)
Follow Dr. Gerome Sam postop eye drop instruction sheet as reviewed.  Eye Surgery Discharge Instructions  Expect mild scratchy sensation or mild soreness. DO NOT RUB YOUR EYE!  The day of surgery:  Minimal physical activity, but bed rest is not required  No reading, computer work, or close hand work  No bending, lifting, or straining.  May watch TV  For 24 hours:  No driving, legal decisions, or alcoholic beverages  Safety precautions  Eat anything you prefer: It is better to start with liquids, then soup then solid foods.  Solar shield eyeglasses should be worn for comfort in the sunlight/patch while sleeping  Resume all regular medications including aspirin or Coumadin if these were discontinued prior to surgery. You may shower, bathe, shave, or wash your hair. Tylenol may be taken for mild discomfort.  Call your doctor if you experience significant pain, nausea, or vomiting, fever > 101 or other signs of infection. [phone] or [phone] Specific instructions:  Follow-up Information    Galen Manila, MD Follow up.   Specialty:  Ophthalmology Why:  06/17/17 @ 10:50 AM Contact information: [address] [place] [address] [phone]

## 2017-06-16 NOTE — Transfer of Care (Signed)
Immediate Anesthesia Transfer of Care Note  Patient: Laurie Atkinson  Procedure(s) Performed: CATARACT EXTRACTION PHACO AND INTRAOCULAR LENS PLACEMENT (IOC) (Left Eye)  Patient Location: Short Stay  Anesthesia Type:MAC  Level of Consciousness: awake, alert , oriented and patient cooperative  Airway & Oxygen Therapy: Patient spontaneously breathing  Post-op Assessment: Report given to RN and Post -op Vital signs reviewed and stable  Post vital signs: Reviewed and stable  Last Vitals:  Vitals Value Taken Time  BP 136/58 06/16/2017  9:58 AM  Temp 36.6 C 06/16/2017  9:58 AM  Pulse 62 06/16/2017  9:58 AM  Resp 18 06/16/2017  9:58 AM  SpO2 97 % 06/16/2017  9:58 AM    Last Pain:  Vitals:   06/16/17 0958  TempSrc: Oral  PainSc: 0-No pain         Complications: No apparent anesthesia complications

## 2017-06-16 NOTE — Op Note (Signed)
PREOPERATIVE DIAGNOSIS:  Nuclear sclerotic cataract of the left eye.   POSTOPERATIVE DIAGNOSIS:  Nuclear sclerotic cataract of the left eye.   OPERATIVE PROCEDURE: Procedure(s): CATARACT EXTRACTION PHACO AND INTRAOCULAR LENS PLACEMENT (IOC)   SURGEON:  Galen Manila, MD.   ANESTHESIA:  Anesthesiologist: Christia Reading, MD CRNA: Dava Najjar, CRNA  1.      Managed anesthesia care. 2.     0.25ml of Shugarcaine was instilled following the paracentesis   COMPLICATIONS:  None.   TECHNIQUE:   Stop and chop   DESCRIPTION OF PROCEDURE:  The patient was examined and consented in the preoperative holding area where the aforementioned topical anesthesia was applied to the left eye and then brought back to the Operating Room where the left eye was prepped and draped in the usual sterile ophthalmic fashion and a lid speculum was placed. A paracentesis was created with the side port blade and the anterior chamber was filled with viscoelastic. A near clear corneal incision was performed with the steel keratome. A continuous curvilinear capsulorrhexis was performed with a cystotome followed by the capsulorrhexis forceps. Hydrodissection and hydrodelineation were carried out with BSS on a blunt cannula. The lens was removed in a stop and chop  technique and the remaining cortical material was removed with the irrigation-aspiration handpiece. The capsular bag was inflated with viscoelastic and the Technis ZCB00 lens was placed in the capsular bag without complication. The remaining viscoelastic was removed from the eye with the irrigation-aspiration handpiece. The wounds were hydrated. The anterior chamber was flushed with Miostat and the eye was inflated to physiologic pressure. 0.66ml Vigamox was placed in the anterior chamber. The wounds were found to be water tight. The eye was dressed with Vigamox. The patient was given protective glasses to wear throughout the day and a shield with which to sleep  tonight. The patient was also given drops with which to begin a drop regimen today and will follow-up with me in one day. Implant Name Type Inv. Item Serial No. Manufacturer Lot No. LRB No. Used  LENS IOL DIOP 23.0 - [id number] Intraocular Lens LENS IOL DIOP 23.0 [id number]  Left 1    Procedure(s) with comments: CATARACT EXTRACTION PHACO AND INTRAOCULAR LENS PLACEMENT (IOC) (Left) - [place] 00:46 AP% 14.1 CDE 6.56 Fluid pack lot # [id number]  Electronically signed: Galen Manila 06/16/2017 9:56 AM

## 2017-06-16 NOTE — H&P (Signed)
All labs reviewed. Abnormal studies sent to patients PCP when indicated.  Previous H&P reviewed, patient examined, there are NO CHANGES.  Laurie Moten Porfilio5/15/20199:29 AM

## 2017-07-26 ENCOUNTER — Encounter: Payer: Self-pay | Admitting: *Deleted

## 2017-07-27 ENCOUNTER — Ambulatory Visit
Admission: RE | Admit: 2017-07-27 | Discharge: 2017-07-27 | Disposition: A | Discharge status: Home / Self Care | Payer: Medicare Other | Source: Ambulatory Visit | Attending: Ophthalmology | Admitting: Ophthalmology

## 2017-07-27 ENCOUNTER — Other Ambulatory Visit: Payer: Self-pay

## 2017-07-27 ENCOUNTER — Ambulatory Visit: Payer: Medicare Other | Admitting: Certified Registered"

## 2017-07-27 ENCOUNTER — Encounter
Admission: RE | Disposition: A | Discharge status: Home / Self Care | Payer: Self-pay | Source: Ambulatory Visit | Attending: Ophthalmology

## 2017-07-27 DIAGNOSIS — I10 Essential (primary) hypertension: Secondary | ICD-10-CM | POA: Diagnosis not present

## 2017-07-27 DIAGNOSIS — E039 Hypothyroidism, unspecified: Secondary | ICD-10-CM | POA: Diagnosis not present

## 2017-07-27 DIAGNOSIS — I739 Peripheral vascular disease, unspecified: Secondary | ICD-10-CM | POA: Diagnosis not present

## 2017-07-27 DIAGNOSIS — G629 Polyneuropathy, unspecified: Secondary | ICD-10-CM | POA: Insufficient documentation

## 2017-07-27 DIAGNOSIS — I2510 Atherosclerotic heart disease of native coronary artery without angina pectoris: Secondary | ICD-10-CM | POA: Diagnosis not present

## 2017-07-27 DIAGNOSIS — Z79890 Hormone replacement therapy (postmenopausal): Secondary | ICD-10-CM | POA: Diagnosis not present

## 2017-07-27 DIAGNOSIS — Z882 Allergy status to sulfonamides status: Secondary | ICD-10-CM | POA: Diagnosis not present

## 2017-07-27 DIAGNOSIS — I252 Old myocardial infarction: Secondary | ICD-10-CM | POA: Diagnosis not present

## 2017-07-27 DIAGNOSIS — Z880 Allergy status to penicillin: Secondary | ICD-10-CM | POA: Diagnosis not present

## 2017-07-27 DIAGNOSIS — Z7982 Long term (current) use of aspirin: Secondary | ICD-10-CM | POA: Insufficient documentation

## 2017-07-27 DIAGNOSIS — Z79899 Other long term (current) drug therapy: Secondary | ICD-10-CM | POA: Diagnosis not present

## 2017-07-27 DIAGNOSIS — H2511 Age-related nuclear cataract, right eye: Secondary | ICD-10-CM | POA: Diagnosis present

## 2017-07-27 DIAGNOSIS — K219 Gastro-esophageal reflux disease without esophagitis: Secondary | ICD-10-CM | POA: Diagnosis not present

## 2017-07-27 DIAGNOSIS — Z9842 Cataract extraction status, left eye: Secondary | ICD-10-CM | POA: Diagnosis not present

## 2017-07-27 HISTORY — PX: CATARACT EXTRACTION W/PHACO: SHX586

## 2017-07-27 SURGERY — PHACOEMULSIFICATION, CATARACT, WITH IOL INSERTION
Anesthesia: Monitor Anesthesia Care | Site: Eye | Laterality: Right | Wound class: Clean

## 2017-07-27 MED ORDER — POVIDONE-IODINE 5 % OP SOLN
OPHTHALMIC | Status: DC | PRN
  Administered 2017-07-27: 1 via OPHTHALMIC

## 2017-07-27 MED ORDER — EPINEPHRINE PF 1 MG/ML IJ SOLN
INTRAMUSCULAR | Status: AC
  Filled 2017-07-27: qty 2

## 2017-07-27 MED ORDER — LIDOCAINE HCL (PF) 4 % IJ SOLN
INTRAMUSCULAR | Status: AC
  Filled 2017-07-27: qty 5

## 2017-07-27 MED ORDER — MOXIFLOXACIN HCL 0.5 % OP SOLN
OPHTHALMIC | Status: DC | PRN
  Administered 2017-07-27: 0.2 mL via OPHTHALMIC

## 2017-07-27 MED ORDER — MIDAZOLAM HCL 2 MG/2ML IJ SOLN
INTRAMUSCULAR | Status: AC
  Filled 2017-07-27: qty 2

## 2017-07-27 MED ORDER — SODIUM CHLORIDE 0.9 % IV SOLN: INTRAVENOUS | Status: DC

## 2017-07-27 MED ORDER — BSS IO SOLN
INTRAOCULAR | Status: DC | PRN
  Administered 2017-07-27: 4 mL via OPHTHALMIC

## 2017-07-27 MED ORDER — MIDAZOLAM HCL 2 MG/2ML IJ SOLN
INTRAMUSCULAR | Status: DC | PRN
  Administered 2017-07-27: 1 mg via INTRAVENOUS

## 2017-07-27 MED ORDER — MOXIFLOXACIN HCL 0.5 % OP SOLN
OPHTHALMIC | Status: AC
  Filled 2017-07-27: qty 3

## 2017-07-27 MED ORDER — NA CHONDROIT SULF-NA HYALURON 40-17 MG/ML IO SOLN
INTRAOCULAR | Status: DC | PRN
  Administered 2017-07-27: 1 mL via INTRAOCULAR

## 2017-07-27 MED ORDER — CARBACHOL 0.01 % IO SOLN
INTRAOCULAR | Status: DC | PRN
  Administered 2017-07-27: 0.5 mL via INTRAOCULAR

## 2017-07-27 MED ORDER — POVIDONE-IODINE 5 % OP SOLN
OPHTHALMIC | Status: AC
  Filled 2017-07-27: qty 30

## 2017-07-27 MED ORDER — EPINEPHRINE PF 1 MG/ML IJ SOLN
INTRAOCULAR | Status: DC | PRN
  Administered 2017-07-27: 08:00:00 via OPHTHALMIC

## 2017-07-27 MED ORDER — ARMC OPHTHALMIC DILATING DROPS
OPHTHALMIC | Status: AC
  Filled 2017-07-27: qty 0.4

## 2017-07-27 MED ORDER — ARMC OPHTHALMIC DILATING DROPS
1.0000 "application " | OPHTHALMIC | Status: AC
  Administered 2017-07-27 (×2): 1 via OPHTHALMIC
  Administered 2017-07-27: 07:00:00 via OPHTHALMIC

## 2017-07-27 MED ORDER — MOXIFLOXACIN HCL 0.5 % OP SOLN: 1.0000 [drp] | OPHTHALMIC | Status: DC | PRN

## 2017-07-27 MED ORDER — NA CHONDROIT SULF-NA HYALURON 40-17 MG/ML IO SOLN
INTRAOCULAR | Status: AC
  Filled 2017-07-27: qty 1

## 2017-07-27 SURGICAL SUPPLY — 16 items
GLOVE BIO SURGEON STRL SZ8 (GLOVE) ×2 IMPLANT
GLOVE BIOGEL M 6.5 STRL (GLOVE) ×2 IMPLANT
GLOVE SURG LX 8.0 MICRO (GLOVE) ×1
GLOVE SURG LX STRL 8.0 MICRO (GLOVE) ×1 IMPLANT
GOWN STRL REUS W/ TWL LRG LVL3 (GOWN DISPOSABLE) ×2 IMPLANT
GOWN STRL REUS W/TWL LRG LVL3 (GOWN DISPOSABLE) ×4
LABEL CATARACT MEDS ST (LABEL) ×2 IMPLANT
LENS IOL TECNIS ITEC 23.0 (Intraocular Lens) ×1 IMPLANT
PACK CATARACT (MISCELLANEOUS) ×2 IMPLANT
PACK CATARACT BRASINGTON LX (MISCELLANEOUS) ×2 IMPLANT
PACK EYE AFTER SURG (MISCELLANEOUS) ×2 IMPLANT
SOL BSS BAG (MISCELLANEOUS) ×2
SOLUTION BSS BAG (MISCELLANEOUS) ×1 IMPLANT
SYR 5ML LL (SYRINGE) ×2 IMPLANT
WATER STERILE IRR 250ML POUR (IV SOLUTION) ×2 IMPLANT
WIPE NON LINTING 3.25X3.25 (MISCELLANEOUS) ×2 IMPLANT

## 2017-07-27 NOTE — Anesthesia Postprocedure Evaluation (Signed)
Anesthesia Post Note  Patient: Laurie Atkinson  Procedure(s) Performed: CATARACT EXTRACTION PHACO AND INTRAOCULAR LENS PLACEMENT (IOC) (Right Eye)  Patient location during evaluation: PACU Anesthesia Type: MAC Level of consciousness: awake and awake and alert Pain management: pain level controlled Vital Signs Assessment: post-procedure vital signs reviewed and stable Respiratory status: spontaneous breathing, nonlabored ventilation and respiratory function stable Cardiovascular status: stable Anesthetic complications: no     Last Vitals:  Vitals:   07/27/17 0655 07/27/17 0833  BP: (!) 173/62 (!) 145/60  Pulse: 61   Resp: 16 12  Temp: (!) 35.8 C   SpO2: 98% 100%    Last Pain:  Vitals:   07/27/17 0655  TempSrc: Tympanic                 Lance Muss

## 2017-07-27 NOTE — Anesthesia Preprocedure Evaluation (Signed)
Anesthesia Evaluation  Patient identified by MRN, date of birth, ID band Patient awake    Reviewed: Allergy & Precautions, H&P , NPO status , reviewed documented beta blocker date and time   History of Anesthesia Complications (+) history of anesthetic complications  Airway Mallampati: II  TM Distance: >3 FB Neck ROM: full    Dental  (+) Partial Upper, Partial Lower   Pulmonary    Pulmonary exam normal        Cardiovascular hypertension, + CAD, + Past MI and + Peripheral Vascular Disease  Normal cardiovascular exam     Neuro/Psych  Headaches, PSYCHIATRIC DISORDERS Anxiety Depression    GI/Hepatic GERD  ,  Endo/Other  Hypothyroidism   Renal/GU Renal disease     Musculoskeletal  (+) Arthritis ,   Abdominal   Peds  Hematology   Anesthesia Other Findings Past Medical History: No date: Abnormality of gait No date: Aneurysm of renal artery (HCC) No date: Anxiety state, unspecified No date: Arthritis     Comment:  knees & neck  4970'Y: Complication of anesthesia     Comment:  decreased BP from medicine relative anesth. & her               surgery got cancelled , but had surgery the next week,               with no problems  No date: Coronary artery disease No date: Depression No date: Diplopia No date: GERD (gastroesophageal reflux disease)     Comment:  HIATAL HERNIA 2014: H/O echocardiogram     Comment:  [facility] for ? indigestion, told that it was wnl No date: Headache     Comment:  distant history of No date: History of blood transfusion     Comment:  with colon surgery  No date: Hypercalcemia No date: Hypertension No date: Hypothyroidism 1960's : Myocardial infarction ([place])     Comment:  ? when No date: Neuropathy No date: Other specified disorder of peritoneum March 2015: Renal artery aneurysm ([place]) No date: Unspecified disorder of kidney and ureter  Past Surgical History: No date:  ABDOMINAL HYSTERECTOMY 04/05/2015: ANTERIOR CERVICAL DECOMP/DISCECTOMY FUSION; N/A     Comment:  Procedure: Anterior Cervical Discectomy Decompression               Fusion - Cervical three-Cervical four ;  Surgeon: Eustace Moore, MD;  Location: [facility];  Service:               Neurosurgery;  Laterality: N/A; No date: APPENDECTOMY No date: BACK SURGERY 1960's?: CARDIAC CATHETERIZATION     Comment:  told- no blockages  06/16/2017: CATARACT EXTRACTION W/PHACO; Left     Comment:  Procedure: CATARACT EXTRACTION PHACO AND INTRAOCULAR               LENS PLACEMENT (IOC);  Surgeon: Birder Robson, MD;                Location: [facility] ORS;  Service: Ophthalmology;  Laterality:              Left;  [place] 00:46 AP% 14.1 CDE 6.56 Fluid pack lot #               [id number] No date: CHOLECYSTECTOMY No date: COLON SURGERY No date: HAND SURGERY     Comment:  thumb joint replacement  04/2012: HERNIA REPAIR     Comment:  Umbilical/ HIATAL No date: TONSILLECTOMY  BMI    Body Mass Index:  28.66 kg/m      Reproductive/Obstetrics                             Anesthesia Physical Anesthesia Plan  ASA: III  Anesthesia Plan: MAC   Post-op Pain Management:    Induction:   PONV Risk Score and Plan: Treatment may vary due to age or medical condition, TIVA, Ondansetron and Midazolam  Airway Management Planned:   Additional Equipment:   Intra-op Plan:   Post-operative Plan:   Informed Consent: I have reviewed the patients History and Physical, chart, labs and discussed the procedure including the risks, benefits and alternatives for the proposed anesthesia with the patient or authorized representative who has indicated his/her understanding and acceptance.   Dental Advisory Given  Plan Discussed with: CRNA  Anesthesia Plan Comments:         Anesthesia Quick Evaluation

## 2017-07-27 NOTE — Anesthesia Procedure Notes (Signed)
Procedure Name: MAC Performed by: Lance Muss, CRNA Pre-anesthesia Checklist: Patient identified, Emergency Drugs available, Suction available, Patient being monitored and Timeout performed Oxygen Delivery Method: Nasal cannula

## 2017-07-27 NOTE — Transfer of Care (Signed)
Immediate Anesthesia Transfer of Care Note  Patient: DANDRA SHAMBAUGH  Procedure(s) Performed: CATARACT EXTRACTION PHACO AND INTRAOCULAR LENS PLACEMENT (IOC) (Right Eye)  Patient Location: PACU  Anesthesia Type:MAC  Level of Consciousness: awake and alert   Airway & Oxygen Therapy: Patient Spontanous Breathing  Post-op Assessment: Report given to RN and Post -op Vital signs reviewed and stable  Post vital signs: Reviewed and stable  Last Vitals:  Vitals Value Taken Time  BP 145/60 07/27/2017  8:33 AM  Temp    Pulse    Resp 12 07/27/2017  8:33 AM  SpO2 100 % 07/27/2017  8:33 AM    Last Pain:  Vitals:   07/27/17 0655  TempSrc: Tympanic         Complications: No apparent anesthesia complications

## 2017-07-27 NOTE — Discharge Instructions (Signed)
Eye Surgery Discharge Instructions    Expect mild scratchy sensation or mild soreness. DO NOT RUB YOUR EYE!  The day of surgery:  Minimal physical activity, but bed rest is not required  No reading, computer work, or close hand work  No bending, lifting, or straining.  May watch TV  For 24 hours:  No driving, legal decisions, or alcoholic beverages  Safety precautions  Eat anything you prefer: It is better to start with liquids, then soup then solid foods.  _____ Eye patch should be worn until postoperative exam tomorrow.  ____ Solar shield eyeglasses should be worn for comfort in the sunlight/patch while sleeping  Resume all regular medications including aspirin or Coumadin if these were discontinued prior to surgery. You may shower, bathe, shave, or wash your hair. Tylenol may be taken for mild discomfort.  Call your doctor if you experience significant pain, nausea, or vomiting, fever > 101 or other signs of infection. [phone] or [phone] Specific instructions:  Follow-up Information    Galen Manila, MD Follow up.   Specialty:  Ophthalmology Why:  June 26 at 11:00am Contact information: [address] [place] [address] [phone]          Eye Surgery Discharge Instructions    Expect mild scratchy sensation or mild soreness. DO NOT RUB YOUR EYE!  The day of surgery:  Minimal physical activity, but bed rest is not required  No reading, computer work, or close hand work  No bending, lifting, or straining.  May watch TV  For 24 hours:  No driving, legal decisions, or alcoholic beverages  Safety precautions  Eat anything you prefer: It is better to start with liquids, then soup then solid foods.  _____ Eye patch should be worn until postoperative exam tomorrow.  ____ Solar shield eyeglasses should be worn for comfort in the sunlight/patch while sleeping  Resume all regular medications including aspirin or Coumadin if  these were discontinued prior to surgery. You may shower, bathe, shave, or wash your hair. Tylenol may be taken for mild discomfort.  Call your doctor if you experience significant pain, nausea, or vomiting, fever > 101 or other signs of infection. [phone] or [phone] Specific instructions:  Follow-up Information    Galen Manila, MD Follow up.   Specialty:  Ophthalmology Why:  June 26 at 11:00am Contact information: [address] [place] [address] [phone]          Eye Surgery Discharge Instructions    Expect mild scratchy sensation or mild soreness. DO NOT RUB YOUR EYE!  The day of surgery:  Minimal physical activity, but bed rest is not required  No reading, computer work, or close hand work  No bending, lifting, or straining.  May watch TV  For 24 hours:  No driving, legal decisions, or alcoholic beverages  Safety precautions  Eat anything you prefer: It is better to start with liquids, then soup then solid foods.  _____ Eye patch should be worn until postoperative exam tomorrow.  ____ Solar shield eyeglasses should be worn for comfort in the sunlight/patch while sleeping  Resume all regular medications including aspirin or Coumadin if these were discontinued prior to surgery. You may shower, bathe, shave, or wash your hair. Tylenol may be taken for mild discomfort.  Call your doctor if you experience significant pain, nausea, or vomiting, fever > 101 or other signs of infection. [phone] or [phone] Specific instructions:  Follow-up Information    Galen Manila, MD Follow up.   Specialty:  Ophthalmology Why:  June 26 at 11:00am Contact information: [address] [place] [id number] [phone]

## 2017-07-27 NOTE — Op Note (Signed)
PREOPERATIVE DIAGNOSIS:  Nuclear sclerotic cataract of the right eye.   POSTOPERATIVE DIAGNOSIS:  NUCLEAR SCLEROTIC CATARACT RIGHT EYE   OPERATIVE PROCEDURE: Procedure(s): CATARACT EXTRACTION PHACO AND INTRAOCULAR LENS PLACEMENT (IOC)   SURGEON:  Galen ManilaWilliam Raeden Schippers, MD.   ANESTHESIA:  Anesthesiologist: Christia ReadingHowell, Scott T, MD CRNA: Casey BurkittHoang, Thuy, CRNA  1.      Managed anesthesia care. 2.      0.431ml of Shugarcaine was instilled in the eye following the paracentesis.   COMPLICATIONS:  None.   TECHNIQUE:   Stop and chop   DESCRIPTION OF PROCEDURE:  The patient was examined and consented in the preoperative holding area where the aforementioned topical anesthesia was applied to the right eye and then brought back to the Operating Room where the right eye was prepped and draped in the usual sterile ophthalmic fashion and a lid speculum was placed. A paracentesis was created with the side port blade and the anterior chamber was filled with viscoelastic. A near clear corneal incision was performed with the steel keratome. A continuous curvilinear capsulorrhexis was performed with a cystotome followed by the capsulorrhexis forceps. Hydrodissection and hydrodelineation were carried out with BSS on a blunt cannula. The lens was removed in a stop and chop  technique and the remaining cortical material was removed with the irrigation-aspiration handpiece. The capsular bag was inflated with viscoelastic and the Technis ZCB00  lens was placed in the capsular bag without complication. The remaining viscoelastic was removed from the eye with the irrigation-aspiration handpiece. The wounds were hydrated. The anterior chamber was flushed with Miostat and the eye was inflated to physiologic pressure. 0.391ml of Vigamox was placed in the anterior chamber. The wounds were found to be water tight. The eye was dressed with Vigamox. The patient was given protective glasses to wear throughout the day and a shield with which to  sleep tonight. The patient was also given drops with which to begin a drop regimen today and will follow-up with me in one day. Implant Name Type Inv. Item Serial No. Manufacturer Lot No. LRB No. Used  LENS IOL DIOP 23.0 - [id number] Intraocular Lens LENS IOL DIOP 23.0 [id number]  Right 1   Procedure(s) with comments: CATARACT EXTRACTION PHACO AND INTRAOCULAR LENS PLACEMENT (IOC) (Right) - [place] 00:32.3 AP% 12.7 CDE 4.08 Fluid Pack Lot # [id number]  Electronically signed: Galen ManilaWilliam Dijon Kohlman 07/27/2017 8:32 AM

## 2017-07-27 NOTE — H&P (Signed)
All labs reviewed. Abnormal studies sent to patients PCP when indicated.  Previous H&P reviewed, patient examined, there are NO CHANGES.  Laurie Hutmacher Porfilio6/25/20198:06 AM

## 2017-07-27 NOTE — Anesthesia Post-op Follow-up Note (Signed)
Anesthesia QCDR form completed.        

## 2017-07-28 ENCOUNTER — Encounter: Payer: Self-pay | Admitting: Ophthalmology

## 2017-08-30 DIAGNOSIS — Z862 Personal history of diseases of the blood and blood-forming organs and certain disorders involving the immune mechanism: Secondary | ICD-10-CM

## 2018-03-04 DIAGNOSIS — Z862 Personal history of diseases of the blood and blood-forming organs and certain disorders involving the immune mechanism: Secondary | ICD-10-CM

## 2018-05-19 DIAGNOSIS — G9341 Metabolic encephalopathy: Secondary | ICD-10-CM

## 2018-05-19 DIAGNOSIS — N390 Urinary tract infection, site not specified: Secondary | ICD-10-CM | POA: Diagnosis not present

## 2018-05-19 DIAGNOSIS — N183 Chronic kidney disease, stage 3 (moderate): Secondary | ICD-10-CM

## 2018-05-19 DIAGNOSIS — R531 Weakness: Secondary | ICD-10-CM

## 2018-05-19 DIAGNOSIS — D72829 Elevated white blood cell count, unspecified: Secondary | ICD-10-CM | POA: Diagnosis not present

## 2018-05-19 DIAGNOSIS — R1900 Intra-abdominal and pelvic swelling, mass and lump, unspecified site: Secondary | ICD-10-CM

## 2018-05-20 DIAGNOSIS — N390 Urinary tract infection, site not specified: Secondary | ICD-10-CM | POA: Diagnosis not present

## 2018-05-20 DIAGNOSIS — R1900 Intra-abdominal and pelvic swelling, mass and lump, unspecified site: Secondary | ICD-10-CM | POA: Diagnosis not present

## 2018-05-20 DIAGNOSIS — R531 Weakness: Secondary | ICD-10-CM | POA: Diagnosis not present

## 2018-05-20 DIAGNOSIS — G9341 Metabolic encephalopathy: Secondary | ICD-10-CM | POA: Diagnosis not present

## 2018-05-21 DIAGNOSIS — G9341 Metabolic encephalopathy: Secondary | ICD-10-CM | POA: Diagnosis not present

## 2018-05-21 DIAGNOSIS — R531 Weakness: Secondary | ICD-10-CM | POA: Diagnosis not present

## 2018-05-21 DIAGNOSIS — N390 Urinary tract infection, site not specified: Secondary | ICD-10-CM | POA: Diagnosis not present

## 2018-05-21 DIAGNOSIS — R1900 Intra-abdominal and pelvic swelling, mass and lump, unspecified site: Secondary | ICD-10-CM | POA: Diagnosis not present

## 2018-05-22 DIAGNOSIS — R531 Weakness: Secondary | ICD-10-CM | POA: Diagnosis not present

## 2018-05-22 DIAGNOSIS — G9341 Metabolic encephalopathy: Secondary | ICD-10-CM | POA: Diagnosis not present

## 2018-05-22 DIAGNOSIS — N390 Urinary tract infection, site not specified: Secondary | ICD-10-CM | POA: Diagnosis not present

## 2018-05-22 DIAGNOSIS — R1900 Intra-abdominal and pelvic swelling, mass and lump, unspecified site: Secondary | ICD-10-CM | POA: Diagnosis not present

## 2018-09-02 DIAGNOSIS — D509 Iron deficiency anemia, unspecified: Secondary | ICD-10-CM | POA: Diagnosis not present

## 2018-09-21 DIAGNOSIS — E039 Hypothyroidism, unspecified: Secondary | ICD-10-CM | POA: Diagnosis not present

## 2018-09-21 DIAGNOSIS — D649 Anemia, unspecified: Secondary | ICD-10-CM

## 2018-09-21 DIAGNOSIS — I10 Essential (primary) hypertension: Secondary | ICD-10-CM | POA: Diagnosis not present

## 2018-09-21 DIAGNOSIS — N390 Urinary tract infection, site not specified: Secondary | ICD-10-CM

## 2018-09-21 DIAGNOSIS — K668 Other specified disorders of peritoneum: Secondary | ICD-10-CM | POA: Diagnosis not present

## 2018-09-21 DIAGNOSIS — E860 Dehydration: Secondary | ICD-10-CM

## 2018-09-21 DIAGNOSIS — K255 Chronic or unspecified gastric ulcer with perforation: Secondary | ICD-10-CM

## 2018-09-22 DIAGNOSIS — K255 Chronic or unspecified gastric ulcer with perforation: Secondary | ICD-10-CM | POA: Diagnosis not present

## 2018-09-22 DIAGNOSIS — I10 Essential (primary) hypertension: Secondary | ICD-10-CM | POA: Diagnosis not present

## 2018-09-22 DIAGNOSIS — K668 Other specified disorders of peritoneum: Secondary | ICD-10-CM | POA: Diagnosis not present

## 2018-09-22 DIAGNOSIS — E039 Hypothyroidism, unspecified: Secondary | ICD-10-CM | POA: Diagnosis not present

## 2018-09-23 DIAGNOSIS — I10 Essential (primary) hypertension: Secondary | ICD-10-CM | POA: Diagnosis not present

## 2018-09-23 DIAGNOSIS — E860 Dehydration: Secondary | ICD-10-CM | POA: Diagnosis not present

## 2018-09-23 DIAGNOSIS — K668 Other specified disorders of peritoneum: Secondary | ICD-10-CM | POA: Diagnosis not present

## 2018-09-23 DIAGNOSIS — E039 Hypothyroidism, unspecified: Secondary | ICD-10-CM | POA: Diagnosis not present

## 2018-09-24 DIAGNOSIS — E039 Hypothyroidism, unspecified: Secondary | ICD-10-CM | POA: Diagnosis not present

## 2018-09-24 DIAGNOSIS — K668 Other specified disorders of peritoneum: Secondary | ICD-10-CM | POA: Diagnosis not present

## 2018-09-24 DIAGNOSIS — N390 Urinary tract infection, site not specified: Secondary | ICD-10-CM | POA: Diagnosis not present

## 2018-09-24 DIAGNOSIS — I10 Essential (primary) hypertension: Secondary | ICD-10-CM | POA: Diagnosis not present

## 2018-09-25 DIAGNOSIS — K668 Other specified disorders of peritoneum: Secondary | ICD-10-CM | POA: Diagnosis not present

## 2018-09-25 DIAGNOSIS — I10 Essential (primary) hypertension: Secondary | ICD-10-CM | POA: Diagnosis not present

## 2018-09-25 DIAGNOSIS — E039 Hypothyroidism, unspecified: Secondary | ICD-10-CM | POA: Diagnosis not present

## 2018-09-25 DIAGNOSIS — N390 Urinary tract infection, site not specified: Secondary | ICD-10-CM | POA: Diagnosis not present

## 2018-10-04 ENCOUNTER — Ambulatory Visit: Payer: Medicare Other | Admitting: Neurology

## 2018-10-18 ENCOUNTER — Telehealth: Payer: Self-pay | Admitting: Neurology

## 2018-10-18 ENCOUNTER — Other Ambulatory Visit: Payer: Self-pay

## 2018-10-18 ENCOUNTER — Ambulatory Visit (INDEPENDENT_AMBULATORY_CARE_PROVIDER_SITE_OTHER): Payer: Medicare Other | Admitting: Neurology

## 2018-10-18 ENCOUNTER — Encounter: Payer: Self-pay | Admitting: Neurology

## 2018-10-18 VITALS — BP 144/77 | HR 66 | Temp 98.0°F | Ht 67.0 in | Wt 175.0 lb

## 2018-10-18 DIAGNOSIS — R269 Unspecified abnormalities of gait and mobility: Secondary | ICD-10-CM | POA: Diagnosis not present

## 2018-10-18 DIAGNOSIS — R519 Headache, unspecified: Secondary | ICD-10-CM | POA: Insufficient documentation

## 2018-10-18 DIAGNOSIS — R202 Paresthesia of skin: Secondary | ICD-10-CM

## 2018-10-18 DIAGNOSIS — R51 Headache: Secondary | ICD-10-CM

## 2018-10-18 MED ORDER — TRAMADOL HCL 50 MG PO TABS: 50.0000 mg | ORAL_TABLET | Freq: Four times a day (QID) | ORAL | 5 refills | Status: DC | PRN

## 2018-10-18 MED ORDER — GABAPENTIN 300 MG PO CAPS: 300.0000 mg | ORAL_CAPSULE | Freq: Every day | ORAL | 11 refills | Status: DC

## 2018-10-18 NOTE — Telephone Encounter (Signed)
[employer] medicare order sent to [facility]. No auth they will reach out to the patient to schedule.  

## 2018-10-18 NOTE — Progress Notes (Signed)
PATIENT: Laurie Atkinson DOB: [birth year]  Chief Complaint  Patient presents with  . Migraine    She is here with her daughter, Laurie Atkinson.  Reports pressure headaches daily that vary in severity.  OTC NSAIDS are not helping the pain  States she has experienced migraines for years and reports the last one to have occurred was more than a month ago.  She is also concerned about feeling off balance.  Denies any falls.   Marland Kitchen PCP    Angelina Sheriff, MD     HISTORICAL  Laurie Atkinson is a 85 year old female, seen in request by her primary care physician Dr. Lin Landsman, [name].  Evaluation of chronic headache, initial evaluation was on October 18, 2018.  She is accompanied by her daughter Laurie Atkinson at today's clinical visit.  I have reviewed and summarized the referring note from the referring physician.  She had past medical history of hypertension, hypothyroidism, on supplement, acid reflux disease, peptic ulcer disease with history of perforation,  She reported a history of migraine headaches, her typical migraine are moderate to severe holoacranial severe pounding headache with associated light noise sensitivity, she has stopped having frequent migraine for a while  But since beginning of 2020, she began to have frequent headaches again, bilateral temporal region, ringing in her ears, also complains of neck pain, she is having frequent headaches, mild degree on a daily basis, couple times each week, she has to take Tylenol for headache  Daughter also reported that patient has gradual onset memory loss, increased confusion during UTI, she also complains of few years history of bilateral lower extremity bilateral feet numbness tingling, slow worsening gait abnormality, she has neck pain, radiating pain to left shoulder, she denies significant low back pain.  I personally reviewed CT cervical without contrast February 2017: Multilevel cervical degenerative changes, there was no significant canal  stenosis, variable degree of foraminal narrowing  Laboratory evaluation in 2020, CBC showed hemoglobin of 11.7, normal TSH, CMP, B12 of 558   REVIEW OF SYSTEMS: Full 14 system review of systems performed and notable only for as above All other review of systems were negative.  ALLERGIES: Allergies  Allergen Reactions  . Morphine And Related Hives  . Oxycodone Itching  . Percocet [Oxycodone-Acetaminophen] Itching  . Ambien [Zolpidem] Anxiety    HOME MEDICATIONS: Current Outpatient Medications  Medication Sig Dispense Refill  . acetaminophen (TYLENOL) 500 MG tablet Take 1,000 mg by mouth every 6 (six) hours as needed for moderate pain or headache.     . Apoaequorin (PREVAGEN PO) Take 1 tablet by mouth daily.    Marland Kitchen aspirin 81 MG tablet Take 81 mg by mouth at bedtime.     . diphenhydrAMINE (BENADRYL) 25 mg capsule Take 25 mg by mouth daily as needed for allergies.     Marland Kitchen gabapentin (NEURONTIN) 300 MG capsule Take 300 mg by mouth 3 (three) times daily.     Marland Kitchen levothyroxine (SYNTHROID, LEVOTHROID) 50 MCG tablet TAKE 1 TABLET BY MOUTH ONCE DAILY 30 tablet 3  . Multiple Vitamins-Minerals (CENTRUM SILVER 50+WOMEN PO) Take 1 tablet by mouth daily.    Marland Kitchen olmesartan (BENICAR) 20 MG tablet Take 20 mg by mouth daily.    Marland Kitchen omeprazole (PRILOSEC) 20 MG capsule Take 20 mg by mouth daily.    . Probiotic Product (PROBIOTIC DAILY PO) Take 1 capsule by mouth 2 (two) times daily.     . sodium chloride (OCEAN) 0.65 % SOLN nasal spray Place 2 sprays  into both nostrils at bedtime as needed for congestion.      No current facility-administered medications for this visit.     PAST MEDICAL HISTORY: Past Medical History:  Diagnosis Date  . Abnormality of gait   . Aneurysm of renal artery (HCC)   . Anxiety state, unspecified   . Arthritis    knees & neck   . Complication of anesthesia 1980's   decreased BP from medicine relative anesth. & her surgery got cancelled , but had surgery the next week, with no  problems   . Coronary artery disease   . Depression   . Diplopia   . GERD (gastroesophageal reflux disease)    HIATAL HERNIA  . H/O echocardiogram 2014   [facility] for ? indigestion, told that it was wnl  . Headache    distant history of  . History of blood transfusion    with colon surgery   . Hypercalcemia   . Hypertension   . Hypothyroidism   . Migraine   . Myocardial infarction (HCC) 1960's    ? when  . Neuropathy   . Other specified disorder of peritoneum   . Renal artery aneurysm (HCC) March 2015  . Unspecified disorder of kidney and ureter     PAST SURGICAL HISTORY: Past Surgical History:  Procedure Laterality Date  . ABDOMINAL HYSTERECTOMY    . ANTERIOR CERVICAL DECOMP/DISCECTOMY FUSION N/A 04/05/2015   Procedure: Anterior Cervical Discectomy Decompression Fusion - Cervical three-Cervical four ;  Surgeon: David S Jones, MD;  Location: [facility];  Service: Neurosurgery;  Laterality: N/A;  . APPENDECTOMY    . BACK SURGERY    . CARDIAC CATHETERIZATION  1960's?   told- no blockages   . CATARACT EXTRACTION W/PHACO Left 06/16/2017   Procedure: CATARACT EXTRACTION PHACO AND INTRAOCULAR LENS PLACEMENT (IOC);  Surgeon: Porfilio, William, MD;  Location: [facility];  Service: Ophthalmology;  Laterality: Left;  US 00:46 AP% 14.1 CDE 6.56 Fluid pack lot # [id number]  . CATARACT EXTRACTION W/PHACO Right 07/27/2017   Procedure: CATARACT EXTRACTION PHACO AND INTRAOCULAR LENS PLACEMENT (IOC);  Surgeon: Porfilio, William, MD;  Location: [facility] OR[facility];  Service: Ophthalmology;  Laterality: Right;  US 00:32.3 AP% 12.7 CDE 4.08 Fluid Pack Lot # [id number]  . CHOLECYSTECTOMY    . COLON SURGERY    . HAND SURGERY     thumb joint replacement   . HERNIA REPAIR  04/2012   Umbilical/ HIATAL  . TONSILLECTOMY      FAMILY HISTORY: Family History  Problem Relation Age of Onset  . Heart disease Mother        Heart Disease before age 60  . Stroke Mother        1965  . Heart attack Mother    . Cancer Father        Throat  . Diabetes Father   . Heart attack Father   . Cancer Sister        Breast, Brain, Kidney  . Heart disease Brother        Pacemaker- Before age 60  . Heart attack Brother   . Diabetes Sister     SOCIAL HISTORY: Social History   Socioeconomic History  . Marital status: Married    Spouse name: Not on file  . Number of children: 3  . Years of education: 12  . Highest education level: High school graduate  Occupational History  . Not on file  Social Needs  . Financial resource strain: Not on file  .   Food insecurity    Worry: Not on file    Inability: Not on file  . Transportation needs    Medical: Not on file    Non-medical: Not on file  Tobacco Use  . Smoking status: Never Smoker  . Smokeless tobacco: Never Used  Substance and Sexual Activity  . Alcohol use: No  . Drug use: No  . Sexual activity: Not on file  Lifestyle  . Physical activity    Days per week: Not on file    Minutes per session: Not on file  . Stress: Not on file  Relationships  . Social connections    Talks on phone: Not on file    Gets together: Not on file    Attends religious service: Not on file    Active member of club or organization: Not on file    Attends meetings of clubs or organizations: Not on file    Relationship status: Not on file  . Intimate partner violence    Fear of current or ex partner: Not on file    Emotionally abused: Not on file    Physically abused: Not on file    Forced sexual activity: Not on file  Other Topics Concern  . Not on file  Social History Narrative   Lives at home with her husband.   Right-handed.   Two 16-oz bottles of soda per day.      PHYSICAL EXAM   Vitals:   10/18/18 1359  BP: (!) 144/77  Pulse: 66  Temp: 98 F (36.7 C)  Weight: 175 lb (79.4 kg)  Height: 5' 7" (1.702 m)    Not recorded      Body mass index is 27.41 kg/m.  PHYSICAL EXAMNIATION:  Gen: NAD, conversant, well nourised, well groomed                      Cardiovascular: Regular rate rhythm, no peripheral edema, warm, nontender. Eyes: Conjunctivae clear without exudates or hemorrhage Neck: Supple, no carotid bruits. Pulmonary: Clear to auscultation bilaterally   NEUROLOGICAL EXAM:  MENTAL STATUS: Speech:    Speech is normal; fluent and spontaneous with normal comprehension.  Cognition:     Orientation to time, place and person     Normal recent and remote memory     Normal Attention span and concentration     Normal Language, naming, repeating,spontaneous speech     Fund of knowledge   CRANIAL NERVES: CN II: Visual fields are full to confrontation.  Pupils are round equal and briskly reactive to light. CN III, IV, VI: extraocular movement are normal. No ptosis. CN V: Facial sensation is intact to pinprick in all 3 divisions bilaterally. Corneal responses are intact.  CN VII: Face is symmetric with normal eye closure and smile. CN VIII: Hearing is normal to causal conversation. CN IX, X: Palate elevates symmetrically. Phonation is normal. CN XI: Head turning and shoulder shrug are intact CN XII: Tongue is midline with normal movements and no atrophy.  MOTOR: There is no pronator drift of out-stretched arms. Muscle bulk and tone are normal. Muscle strength is normal.  REFLEXES: Reflexes are 1 and symmetric at the biceps, triceps, knees, and ankles. Plantar responses are flexor.  SENSORY: Length dependent decreased to light touch, pinprick, and vibratory sensation to ankle level  COORDINATION: Rapid alternating movements and fine finger movements are intact. There is no dysmetria on finger-to-nose and heel-knee-shin.    GAIT/STANCE: She needs push-up to get up from   seated position, leaning forward, cautious, mildly unsteady.  DIAGNOSTIC DATA (LABS, IMAGING, TESTING) - I reviewed patient records, labs, notes, testing and imaging myself where available.   ASSESSMENT AND PLAN  Laurie Atkinson is a 85 y.o.  female   Worsening frequent headaches  History of migraine  Short-term memory loss  MRI of the brain to rule out structural lesion  ESR C-reactive protein to rule out temporal arteritis  Not a good candidate for triptan treatment, will try Fioricet as needed, tramadol needed as rescue therapy, less than twice each week  Gait abnormality  Evidence of bilateral feet paresthesia, length dependent sensory changes, need to rule out peripheral neuropathy, also complains of neck pain, radiating pain to left shoulder, MRI of cervical spine for possible cervical spondylitic disease  Marcial Pacas, M.D. Ph.D.  [facility] [address] [place], [place] [address] Ph: [phone] Fax: [phone]  CC: Angelina Sheriff, MD

## 2018-10-19 LAB — C-REACTIVE PROTEIN: CRP: 1 mg/L (ref 0–10)

## 2018-10-19 LAB — SEDIMENTATION RATE: Sed Rate: 35 mm/hr (ref 0–40)

## 2018-11-13 ENCOUNTER — Other Ambulatory Visit: Payer: Self-pay

## 2018-11-13 ENCOUNTER — Ambulatory Visit
Admission: RE | Admit: 2018-11-13 | Discharge: 2018-11-13 | Disposition: A | Discharge status: Home / Self Care | Payer: Medicare Other | Source: Ambulatory Visit | Attending: Neurology | Admitting: Neurology

## 2018-11-13 DIAGNOSIS — R519 Headache, unspecified: Secondary | ICD-10-CM

## 2018-11-15 ENCOUNTER — Telehealth: Payer: Self-pay | Admitting: Neurology

## 2018-11-15 NOTE — Telephone Encounter (Signed)
Please call patient, MRI of the brain showed scattered supratentorium small vessel disease.  There was no acute abnormalities.  IMPRESSION: This MRI of the brain without contrast shows the following: 1.   Scattered T2/flair hyperintense foci in the hemispheres consistent with chronic microvascular ischemic change.  None of the foci appears to be acute. 2.   Single chronic microhemorrhage in the left frontal lobe.  As a single focus, this is unlikely to be clinically significant. 3.   There are no acute findings.

## 2018-11-15 NOTE — Telephone Encounter (Signed)
I spoke to the patient and she is aware of her MRI brain results.  She will keep her pending appts for NCV/EMG and follow up.

## 2018-12-07 ENCOUNTER — Ambulatory Visit (INDEPENDENT_AMBULATORY_CARE_PROVIDER_SITE_OTHER): Payer: Medicare Other | Admitting: Neurology

## 2018-12-07 ENCOUNTER — Ambulatory Visit: Payer: Medicare Other | Admitting: Neurology

## 2018-12-07 ENCOUNTER — Other Ambulatory Visit: Payer: Self-pay

## 2018-12-07 DIAGNOSIS — R519 Headache, unspecified: Secondary | ICD-10-CM

## 2018-12-07 DIAGNOSIS — R202 Paresthesia of skin: Secondary | ICD-10-CM | POA: Diagnosis not present

## 2018-12-07 DIAGNOSIS — R413 Other amnesia: Secondary | ICD-10-CM | POA: Insufficient documentation

## 2018-12-07 DIAGNOSIS — R269 Unspecified abnormalities of gait and mobility: Secondary | ICD-10-CM

## 2018-12-07 MED ORDER — MEMANTINE HCL 10 MG PO TABS: 10.0000 mg | ORAL_TABLET | Freq: Two times a day (BID) | ORAL | 11 refills | Status: DC

## 2018-12-07 MED ORDER — GABAPENTIN 300 MG PO CAPS: 300.0000 mg | ORAL_CAPSULE | Freq: Three times a day (TID) | ORAL | 11 refills | Status: DC

## 2018-12-07 NOTE — Procedures (Signed)
Full Name: Laurie Atkinson Gender: Female MRN #: [id number] Date of Birth: 08/27/1933    Visit Date: 12/07/2018 09:48 Age: 85 Years 1 Months Old Examining Physician: Marcial Pacas, MD  Referring Physician: Marcial Pacas, MD History:   85 year old female complains of bilateral feet paresthesia.  Summary of the Tests: Nerve conduction study: Left sural, and superficial peroneal sensory responses showed borderline decreased snap amplitude.  Right sural, right superficial peroneal sensory responses were normal.  Bilateral tibial, peroneal to EDB motor responses showed mildly decreased conduction velocity.  Electromyography: Selected needle examination of bilateral lower extremities, and bilateral lumbosacral paraspinal muscles showed no significant abnormalities.    Conclusion: This is essentially a normal study.  There is no electrodiagnostic evidence of large fiber peripheral neuropathy or bilateral lumbosacral radiculopathy.    ------------------------------- Marcial Pacas, M.D. PhD  [facility] [place], [place] [address] Tel: [phone] Fax: [phone]        [facility]    Nerve / Sites Muscle Latency Ref. Amplitude Ref. Rel Amp Segments Distance Velocity Ref. Area    ms ms mV mV %  cm m/s m/s mVms  L Peroneal - EDB     Ankle EDB 4.5 ?6.5 2.6 ?2.0 100 Ankle - EDB 9   5.4     Fib head EDB 12.3  1.9  71.2 Fib head - Ankle 30 38 ?44 4.0     Pop fossa EDB 14.7  1.6  85 Pop fossa - Fib head 10 41 ?44 3.4         Pop fossa - Ankle      R Peroneal - EDB     Ankle EDB 5.2 ?6.5 2.0 ?2.0 100 Ankle - EDB 9   4.4     Fib head EDB 13.0  1.6  77.8 Fib head - Ankle 30 38 ?44 3.7     Pop fossa EDB 15.5  1.5  96.3 Pop fossa - Fib head 10 40 ?44 3.6         Pop fossa - Ankle      L Tibial - AH     Ankle AH 4.1 ?5.8 8.1 ?4.0 100 Ankle - AH 9   16.3     Pop fossa AH 14.8  6.1  75.3 Pop fossa - Ankle 37 34 ?41 13.7  R Tibial - AH     Ankle AH 4.4 ?5.8 6.6 ?4.0  100 Ankle - AH 9   11.5     Pop fossa AH 14.5  3.7  55.7 Pop fossa - Ankle 37 37 ?41 8.9             SNC    Nerve / Sites Rec. Site Peak Lat Ref.  Amp Ref. Segments Distance    ms ms V V  cm  L Sural - Ankle (Calf)     Calf Ankle 4.1 ?4.4 4 ?6 Calf - Ankle 14  R Sural - Ankle (Calf)     Calf Ankle 3.6 ?4.4 9 ?6 Calf - Ankle 14  L Superficial peroneal - Ankle     Lat leg Ankle 4.1 ?4.4 5 ?6 Lat leg - Ankle 14  R Superficial peroneal - Ankle     Lat leg Ankle 4.0 ?4.4 10 ?6 Lat leg - Ankle 14              F  Wave    Nerve F Lat Ref.   ms ms  L  Tibial - AH 59.3 ?56.0  R Tibial - AH 58.4 ?56.0         EMG       EMG Summary Table    Spontaneous MUAP Recruitment  Muscle IA Fib PSW Fasc Other Amp Dur. Poly Pattern  L. Tibialis anterior Normal None None None _______ Normal Normal Normal Normal  L. Tibialis posterior Normal None None None _______ Normal Normal Normal Normal  L. Peroneus longus Normal None None None _______ Normal Normal Normal Normal  L. Gastrocnemius (Medial head) Normal None None None _______ Normal Normal Normal Normal  L. Vastus lateralis Normal None None None _______ Normal Normal Normal Normal  L. Abductor hallucis Normal None None None _______ Normal Normal Normal Normal  R. Tibialis anterior Normal None None None _______ Normal Normal Normal Normal  R. Tibialis posterior Normal None None None _______ Normal Normal Normal Normal  R. Peroneus longus Normal None None None _______ Normal Normal Normal Normal  R. Gastrocnemius (Medial head) Normal None None None _______ Normal Normal Normal Normal  R. Vastus lateralis Normal None None None _______ Normal Normal Normal Normal  R. Abductor hallucis Normal None None None _______ Normal Normal Normal Normal  R. Lumbar paraspinals (mid) Normal None None None _______ Normal Normal Normal Normal  R. Lumbar paraspinals (low) Normal None None None _______ Normal Normal Normal Normal  L. Lumbar paraspinals (mid) Normal  None None None _______ Normal Normal Normal Normal  L. Lumbar paraspinals (low) Normal None None None _______ Normal Normal Normal Normal

## 2018-12-07 NOTE — Progress Notes (Signed)
PATIENT: Laurie Atkinson DOB: 1934/01/24  No chief complaint on file.    HISTORICAL  Laurie Atkinson is a 85 year old female, seen in request by her primary care physician Dr. Lin Landsman, [name].  Evaluation of chronic headache, initial evaluation was on October 18, 2018.  She is accompanied by her daughter Laurie Atkinson at today's clinical visit.  I have reviewed and summarized the referring note from the referring physician.  She had past medical history of hypertension, hypothyroidism, on supplement, acid reflux disease, peptic ulcer disease with history of perforation,  She reported a history of migraine headaches, her typical migraine are moderate to severe holoacranial severe pounding headache with associated light noise sensitivity, she has stopped having frequent migraine for a while  But since beginning of 2020, she began to have frequent headaches again, bilateral temporal region, ringing in her ears, also complains of neck pain, she is having frequent headaches, mild degree on a daily basis, couple times each week, she has to take Tylenol for headache  Daughter also reported that patient has gradual onset memory loss, increased confusion during UTI, she also complains of few years history of bilateral lower extremity bilateral feet numbness tingling, slow worsening gait abnormality, she has neck pain, radiating pain to left shoulder, she denies significant low back pain.  I personally reviewed CT cervical without contrast February 2017: Multilevel cervical degenerative changes, there was no significant canal stenosis, variable degree of foraminal narrowing  Laboratory evaluation in 2020, CBC showed hemoglobin of 11.7, normal TSH, CMP, B12 of 558  UPDATE Nov 4th 2020: She return for electrodiagnostic study today, which showed no significant abnormality, there is no evidence of large fiber peripheral neuropathy or bilateral lumbosacral radiculopathy  Laboratory evaluations showed normal  ESR, C-reactive protein  She continue complains of frequent bilateral frontal headaches, similar to her migraine in the past, lasting couple hours, couple times each week, Aleve provide partial help  She also complains of bilateral feet paresthesia, tolerating gabapentin 300 mg every night,  Husband also reported she has gradual worsening mild memory loss, mildly unsteady gait, I personally reviewed MRI of the brain without contrast in October 2020: Mild supratentorium small vessel disease, mild generalized atrophy, no acute abnormality, single chronic microhemorrhage in the left frontal lobe.  REVIEW OF SYSTEMS: Full 14 system review of systems performed and notable only for as above All other review of systems were negative.  ALLERGIES: Allergies  Allergen Reactions  . Morphine And Related Hives  . Oxycodone Itching  . Percocet [Oxycodone-Acetaminophen] Itching  . Ambien [Zolpidem] Anxiety    HOME MEDICATIONS: Current Outpatient Medications  Medication Sig Dispense Refill  . acetaminophen (TYLENOL) 500 MG tablet Take 1,000 mg by mouth every 6 (six) hours as needed for moderate pain or headache.     . Apoaequorin (PREVAGEN PO) Take 1 tablet by mouth daily.    Marland Kitchen aspirin 81 MG tablet Take 81 mg by mouth at bedtime.     . diphenhydrAMINE (BENADRYL) 25 mg capsule Take 25 mg by mouth daily as needed for allergies.     Marland Kitchen gabapentin (NEURONTIN) 300 MG capsule Take 1 capsule (300 mg total) by mouth 3 (three) times daily. 90 capsule 11  . levothyroxine (SYNTHROID, LEVOTHROID) 50 MCG tablet TAKE 1 TABLET BY MOUTH ONCE DAILY 30 tablet 3  . memantine (NAMENDA) 10 MG tablet Take 1 tablet (10 mg total) by mouth 2 (two) times daily. 60 tablet 11  . Multiple Vitamins-Minerals (CENTRUM SILVER 50+WOMEN PO) Take 1  tablet by mouth daily.    Marland Kitchen olmesartan (BENICAR) 20 MG tablet Take 20 mg by mouth daily.    Marland Kitchen omeprazole (PRILOSEC) 20 MG capsule Take 20 mg by mouth daily.    . Probiotic Product (PROBIOTIC  DAILY PO) Take 1 capsule by mouth 2 (two) times daily.     . sodium chloride (OCEAN) 0.65 % SOLN nasal spray Place 2 sprays into both nostrils at bedtime as needed for congestion.     . traMADol (ULTRAM) 50 MG tablet Take 1 tablet (50 mg total) by mouth every 6 (six) hours as needed. 30 tablet 5   No current facility-administered medications for this visit.     PAST MEDICAL HISTORY: Past Medical History:  Diagnosis Date  . Abnormality of gait   . Aneurysm of renal artery (HCC)   . Anxiety state, unspecified   . Arthritis    knees & neck   . Complication of anesthesia 1980's   decreased BP from medicine relative anesth. & her surgery got cancelled , but had surgery the next week, with no problems   . Coronary artery disease   . Depression   . Diplopia   . GERD (gastroesophageal reflux disease)    HIATAL HERNIA  . H/O echocardiogram 2014   [facility] for ? indigestion, told that it was wnl  . Headache    distant history of  . History of blood transfusion    with colon surgery   . Hypercalcemia   . Hypertension   . Hypothyroidism   . Migraine   . Myocardial infarction ([place]) 1960's    ? when  . Neuropathy   . Other specified disorder of peritoneum   . Renal artery aneurysm [facility]) March 2015  . Unspecified disorder of kidney and ureter     PAST SURGICAL HISTORY: Past Surgical History:  Procedure Laterality Date  . ABDOMINAL HYSTERECTOMY    . ANTERIOR CERVICAL DECOMP/DISCECTOMY FUSION N/A 04/05/2015   Procedure: Anterior Cervical Discectomy Decompression Fusion - Cervical three-Cervical four ;  Surgeon: Eustace Moore, MD;  Location: [facility];  Service: Neurosurgery;  Laterality: N/A;  . APPENDECTOMY    . BACK SURGERY    . CARDIAC CATHETERIZATION  1960's?   told- no blockages   . CATARACT EXTRACTION W/PHACO Left 06/16/2017   Procedure: CATARACT EXTRACTION PHACO AND INTRAOCULAR LENS PLACEMENT (IOC);  Surgeon: Birder Robson, MD;  Location: [facility] ORS;  Service:  Ophthalmology;  Laterality: Left;  [place] 00:46 AP% 14.1 CDE 6.56 Fluid pack lot # [id number]  . CATARACT EXTRACTION W/PHACO Right 07/27/2017   Procedure: CATARACT EXTRACTION PHACO AND INTRAOCULAR LENS PLACEMENT (IOC);  Surgeon: Birder Robson, MD;  Location: [facility] ORS;  Service: Ophthalmology;  Laterality: Right;  [place] 00:32.3 AP% 12.7 CDE 4.08 Fluid Pack Lot # [id number]  . CHOLECYSTECTOMY    . COLON SURGERY    . HAND SURGERY     thumb joint replacement   . HERNIA REPAIR  06/9739   Umbilical/ HIATAL  . TONSILLECTOMY      FAMILY HISTORY: Family History  Problem Relation Age of Onset  . Heart disease Mother        Heart Disease before age 49  . Stroke Mother        49  . Heart attack Mother   . Cancer Father        Throat  . Diabetes Father   . Heart attack Father   . Cancer Sister        Breast, Brain,  Kidney  . Heart disease Brother        Pacemaker- Before age 67  . Heart attack Brother   . Diabetes Sister     SOCIAL HISTORY: Social History   Socioeconomic History  . Marital status: Married    Spouse name: Not on file  . Number of children: 3  . Years of education: 85  . Highest education level: High school graduate  Occupational History  . Not on file  Social Needs  . Financial resource strain: Not on file  . Food insecurity    Worry: Not on file    Inability: Not on file  . Transportation needs    Medical: Not on file    Non-medical: Not on file  Tobacco Use  . Smoking status: Never Smoker  . Smokeless tobacco: Never Used  Substance and Sexual Activity  . Alcohol use: No  . Drug use: No  . Sexual activity: Not on file  Lifestyle  . Physical activity    Days per week: Not on file    Minutes per session: Not on file  . Stress: Not on file  Relationships  . Social Herbalist on phone: Not on file    Gets together: Not on file    Attends religious service: Not on file    Active member of club or organization: Not on file    Attends  meetings of clubs or organizations: Not on file    Relationship status: Not on file  . Intimate partner violence    Fear of current or ex partner: Not on file    Emotionally abused: Not on file    Physically abused: Not on file    Forced sexual activity: Not on file  Other Topics Concern  . Not on file  Social History Narrative   Lives at home with her husband.   Right-handed.   Two 16-oz bottles of soda per day.      PHYSICAL EXAM   There were no vitals filed for this visit.  Not recorded      There is no height or weight on file to calculate BMI.  PHYSICAL EXAMNIATION:  Gen: NAD, conversant, well nourised, well groomed                     Cardiovascular: Regular rate rhythm, no peripheral edema, warm, nontender. Eyes: Conjunctivae clear without exudates or hemorrhage Neck: Supple, no carotid bruits. Pulmonary: Clear to auscultation bilaterally   NEUROLOGICAL EXAM:  MENTAL STATUS: Speech:    Speech is normal; fluent and spontaneous with normal comprehension.  Cognition:     Orientation to time, place and person     Normal recent and remote memory     Normal Attention span and concentration     Normal Language, naming, repeating,spontaneous speech     Fund of knowledge   CRANIAL NERVES: CN II: Visual fields are full to confrontation.  Pupils are round equal and briskly reactive to light. CN III, IV, VI: extraocular movement are normal. No ptosis. CN V: Facial sensation is intact to pinprick in all 3 divisions bilaterally. Corneal responses are intact.  CN VII: Face is symmetric with normal eye closure and smile. CN VIII: Hearing is normal to causal conversation. CN IX, X: Palate elevates symmetrically. Phonation is normal. CN XI: Head turning and shoulder shrug are intact CN XII: Tongue is midline with normal movements and no atrophy.  MOTOR: There is no pronator drift of out-stretched  arms. Muscle bulk and tone are normal. Muscle strength is normal.   REFLEXES: Reflexes are 1 and symmetric at the biceps, triceps, knees, and ankles. Plantar responses are flexor.  SENSORY: Length dependent decreased to light touch, pinprick, and vibratory sensation to ankle level  COORDINATION: Rapid alternating movements and fine finger movements are intact. There is no dysmetria on finger-to-nose and heel-knee-shin.    GAIT/STANCE: She needs push-up to get up from seated position, leaning forward, cautious, mildly unsteady.  DIAGNOSTIC DATA (LABS, IMAGING, TESTING) - I reviewed patient records, labs, notes, testing and imaging myself where available.   ASSESSMENT AND PLAN  ADRIANAH PROPHETE is a 85 y.o. female   Worsening frequent headaches  History of migraine  Short-term memory loss, add on Namenda 10 mg twice daily  MRI of the brain showed mild generalized atrophy, supratentorium small vessel disease,  ESR C-reactive protein was normal,  tramadol as needed  Gait abnormality  Complains of bilateral feet paresthesia,  EMG nerve conduction study showed no evidence of large fiber peripheral neuropathy or bilateral lumbosacral radiculopathy,  Increase gabapentin 300 mg 3 times daily  Home physical therapy  Marcial Pacas, M.D. Ph.D.  [facility] [address] [place], [place] [address] Ph: [phone] Fax: [phone]  CC: Angelina Sheriff, MD

## 2018-12-08 ENCOUNTER — Telehealth: Payer: Self-pay

## 2018-12-08 NOTE — Telephone Encounter (Signed)
Patient's daughter Laurie Atkinson(DPR verified) called and stated that her parents were confused about the medications prescribed when they went to the pharmacy. She would like a call back to discuss. Please advise.

## 2018-12-08 NOTE — Telephone Encounter (Signed)
I returned the call to the patient's daughter and reviewed the prescriptions that were sent to the pharmacy.

## 2018-12-12 ENCOUNTER — Telehealth: Payer: Self-pay | Admitting: Neurology

## 2018-12-12 NOTE — Telephone Encounter (Signed)
Dina from [facility] called needing VO for the pt to continue her PT from them. Please advise.

## 2018-12-12 NOTE — Telephone Encounter (Signed)
[facility] will continue home PT.  Orders and progress notes will be sent over to Dr. Krista Blue.

## 2018-12-13 ENCOUNTER — Other Ambulatory Visit: Payer: Self-pay

## 2018-12-13 DIAGNOSIS — I722 Aneurysm of renal artery: Secondary | ICD-10-CM

## 2018-12-14 NOTE — Telephone Encounter (Signed)
Leveda Anna from [facility] is calling in stating patient is taking less Namenda due to stating its making her head hurt and head dizziness  CB# [phone]

## 2018-12-14 NOTE — Telephone Encounter (Signed)
I have discussed this concern with Dr. Krista Blue.  They were instructed to reduce the memantine 10mg  dose to 0.5 tablet BID for two weeks.  If she is doing okay, then attempt to increase back up to one tablet BID.  This plan has been discussed with her husband and home health nurse. Both verbalized understanding.

## 2018-12-16 ENCOUNTER — Telehealth (HOSPITAL_COMMUNITY): Payer: Self-pay

## 2018-12-16 NOTE — Telephone Encounter (Signed)

## 2018-12-19 ENCOUNTER — Other Ambulatory Visit: Payer: Self-pay

## 2018-12-19 ENCOUNTER — Encounter: Payer: Self-pay | Admitting: Family

## 2018-12-19 ENCOUNTER — Ambulatory Visit: Payer: Medicare Other | Admitting: Family

## 2018-12-19 ENCOUNTER — Ambulatory Visit (HOSPITAL_COMMUNITY)
Admission: RE | Admit: 2018-12-19 | Discharge: 2018-12-19 | Disposition: A | Discharge status: Home / Self Care | Payer: Medicare Other | Source: Ambulatory Visit | Attending: Family | Admitting: Family

## 2018-12-19 VITALS — BP 171/86 | HR 65 | Temp 97.2°F | Resp 14 | Ht 67.0 in | Wt 172.0 lb

## 2018-12-19 DIAGNOSIS — N281 Cyst of kidney, acquired: Secondary | ICD-10-CM

## 2018-12-19 DIAGNOSIS — I722 Aneurysm of renal artery: Secondary | ICD-10-CM

## 2018-12-19 NOTE — Progress Notes (Signed)
[facility] [place]   CC: Follow up Right Renal Artery Aneurysm  History of Present Illness  Laurie NakayamaBillie F Minter is a 85 y.o. (1933/09/28) female who returns for follow-up of a 1.5 cm right renal artery aneurysm that was initially detected on CT scan incidentally in 2014.  Dr. Myra GianottiBrabham saw her in 2015 with a CT scan. This showed that the aneurysm actually was 1.0 cm in diameter in the distal right renal artery.   She has had some injections for neck pain which has helped, not needed in several years.   Dr. Myra GianottiBrabham last evaluated pt on 11-12-14. At that time renal artery duplex demonstrated a1.2 cm aneurysm on the right renal artery There had been no significant change in the size or aneurysm. We have been following this for several years. Dr. Myra GianottiBrabham indicated it was therefore safe to follow up every 2 years, next visit with an ultrasound.  She had c-spine surgery about 2016, anterior approach.  She denies any hx of stroke or TIA.   She does have a hx of IDA with iron infusions.   She does not seem to have claudication issues with walking. She is being treated for dementia, husband states that she is receiving home OT and PT.   The patient's blood pressure has been labile, per pt and husband: 180/90 several times in one day recently, other times 150/70 at home; pt states drinking vinegar will lower her high blood pressure. Pt states that her PCP is adjusting her blood pressure medications.   The patient's urinary history has remained stable,    Past Medical History:  Diagnosis Date  . Abnormality of gait   . Aneurysm of renal artery (HCC)   . Anxiety state, unspecified   . Arthritis    knees & neck   . Complication of anesthesia 1980's   decreased BP from medicine relative anesth. & her surgery got cancelled , but had surgery the next week, with no problems   . Coronary artery disease   . Depression   . Diplopia   . GERD (gastroesophageal reflux  disease)    HIATAL HERNIA  . H/O echocardiogram 2014   [facility] for ? indigestion, told that it was wnl  . Headache    distant history of  . History of blood transfusion    with colon surgery   . Hypercalcemia   . Hypertension   . Hypothyroidism   . Migraine   . Myocardial infarction (HCC) 1960's    ? when  . Neuropathy   . Other specified disorder of peritoneum   . Renal artery aneurysm [facility](HCC) March 2015  . Unspecified disorder of kidney and ureter    Past Surgical History:  Procedure Laterality Date  . ABDOMINAL HYSTERECTOMY    . ANTERIOR CERVICAL DECOMP/DISCECTOMY FUSION N/A 04/05/2015   Procedure: Anterior Cervical Discectomy Decompression Fusion - Cervical three-Cervical four ;  Surgeon: Tia Alertavid S Jones, MD;  Location: [facility];  Service: Neurosurgery;  Laterality: N/A;  . APPENDECTOMY    . BACK SURGERY    . CARDIAC CATHETERIZATION  1960's?   told- no blockages   . CATARACT EXTRACTION W/PHACO Left 06/16/2017   Procedure: CATARACT EXTRACTION PHACO AND INTRAOCULAR LENS PLACEMENT (IOC);  Surgeon: Galen ManilaPorfilio, William, MD;  Location: [facility] ORS;  Service: Ophthalmology;  Laterality: Left;  [place] 00:46 AP% 14.1 CDE 6.56 Fluid pack lot # [id number]  . CATARACT EXTRACTION W/PHACO Right 07/27/2017   Procedure: CATARACT EXTRACTION PHACO AND INTRAOCULAR LENS PLACEMENT (  [place]);  Surgeon: Birder Robson, MD;  Location: [facility] ORS;  Service: Ophthalmology;  Laterality: Right;  [place] 00:32.3 AP% 12.7 CDE 4.08 Fluid Pack Lot # [id number]  . CHOLECYSTECTOMY    . COLON SURGERY    . HAND SURGERY     thumb joint replacement   . HERNIA REPAIR  04/1495   Umbilical/ HIATAL  . TONSILLECTOMY     Social History Social History   Socioeconomic History  . Marital status: Married    Spouse name: Not on file  . Number of children: 3  . Years of education: 67  . Highest education level: High school graduate  Occupational History  . Not on file  Social Needs  . Financial resource strain: Not on file   . Food insecurity    Worry: Not on file    Inability: Not on file  . Transportation needs    Medical: Not on file    Non-medical: Not on file  Tobacco Use  . Smoking status: Never Smoker  . Smokeless tobacco: Never Used  Substance and Sexual Activity  . Alcohol use: No  . Drug use: No  . Sexual activity: Not on file  Lifestyle  . Physical activity    Days per week: Not on file    Minutes per session: Not on file  . Stress: Not on file  Relationships  . Social Herbalist on phone: Not on file    Gets together: Not on file    Attends religious service: Not on file    Active member of club or organization: Not on file    Attends meetings of clubs or organizations: Not on file    Relationship status: Not on file  . Intimate partner violence    Fear of current or ex partner: Not on file    Emotionally abused: Not on file    Physically abused: Not on file    Forced sexual activity: Not on file  Other Topics Concern  . Not on file  Social History Narrative   Lives at home with her husband.   Right-handed.   Two 16-oz bottles of soda per day.    Family History Family History  Problem Relation Age of Onset  . Heart disease Mother        Heart Disease before age 88  . Stroke Mother        15  . Heart attack Mother   . Cancer Father        Throat  . Diabetes Father   . Heart attack Father   . Cancer Sister        Breast, Brain, Kidney  . Heart disease Brother        Pacemaker- Before age 19  . Heart attack Brother   . Diabetes Sister     Current Outpatient Medications on File Prior to Visit  Medication Sig Dispense Refill  . acetaminophen (TYLENOL) 500 MG tablet Take 1,000 mg by mouth every 6 (six) hours as needed for moderate pain or headache.     . Apoaequorin (PREVAGEN PO) Take 1 tablet by mouth daily.    Marland Kitchen aspirin 81 MG tablet Take 81 mg by mouth at bedtime.     . diphenhydrAMINE (BENADRYL) 25 mg capsule Take 25 mg by mouth daily as needed for  allergies.     Marland Kitchen gabapentin (NEURONTIN) 300 MG capsule Take 1 capsule (300 mg total) by mouth 3 (three) times daily. 90 capsule 11  . levothyroxine (  SYNTHROID, LEVOTHROID) 50 MCG tablet TAKE 1 TABLET BY MOUTH ONCE DAILY 30 tablet 3  . memantine (NAMENDA) 10 MG tablet Take 1 tablet (10 mg total) by mouth 2 (two) times daily. 60 tablet 11  . Multiple Vitamins-Minerals (CENTRUM SILVER 50+WOMEN PO) Take 1 tablet by mouth daily.    Marland Kitchen olmesartan (BENICAR) 20 MG tablet Take 20 mg by mouth daily.    Marland Kitchen omeprazole (PRILOSEC) 20 MG capsule Take 20 mg by mouth daily.    . Probiotic Product (PROBIOTIC DAILY PO) Take 1 capsule by mouth 2 (two) times daily.     . sodium chloride (OCEAN) 0.65 % SOLN nasal spray Place 2 sprays into both nostrils at bedtime as needed for congestion.     . traMADol (ULTRAM) 50 MG tablet Take 1 tablet (50 mg total) by mouth every 6 (six) hours as needed. 30 tablet 5   No current facility-administered medications on file prior to visit.    Allergies  Allergen Reactions  . Morphine And Related Hives  . Oxycodone Itching  . Percocet [Oxycodone-Acetaminophen] Itching  . Ambien [Zolpidem] Anxiety    ROS: See HPI for pertinent positives and negatives.  Physical Examination  Vitals:   12/19/18 0945  BP: (!) 171/86  Pulse: 65  Resp: 14  Temp: (!) 97.2 F (36.2 C)  TempSrc: Temporal  SpO2: 100%  Weight: 172 lb (78 kg)  Height: 5\' 7"  (1.702 m)   Body mass index is 26.94 kg/m.  General: A&O x 3, WD, elderly female in NAD, well appearing, accompanied by her husband. HEENT: Grossly intact and WNL.  Pulmonary: Sym exp, respirations are non labored, fair air movement in all fields, no rales, rhonchi, or wheezes Cardiac: Regular rhythm and rate, no detected murmur.  Carotid Bruits Right Left   Negative Negative   Adominal aortic pulse is not palpable Radial pulses: 2+ right, 1+ left palpable                           VASCULAR EXAM:                                                                                                          LE Pulses Right Left       POPLITEAL  not palpable   not palpable       POSTERIOR TIBIAL  1+ palpable   not palpable        DORSALIS PEDIS      ANTERIOR TIBIAL not palpable  2+ palpable     Gastrointestinal: soft, NTND, -G/R, - HSM, - masses palpated, - CVAT B. Musculoskeletal: M/S 5/5 throughout except 4/5 in right LE, Extremities without ischemic changes  Skin: No rashes, no ulcers, no cellulitis.   Neurologic: CN 2-12 intact, Pain and light touch intact in extremities are intact, Motor exam as listed above. Psychiatric: Normal thought content, mood appropriate to clinical situation.    DATA  Bilateral Renal Artery Duplex (12-19-18): Right: Normal size right kidney. Normal cortical thickness of right  kidney. No evidence of right renal artery stenosis. Distal        right renal artery aneurysm measuring 1.18 x 1.21 cm.        Previously measured 1.22 x 1.20 cm on prior exam 11/16/2016.        No siginifcant change.          Right renal artery is mildly tortuous.          Right kidney - Multiple cysts noted. Largest - Inferior pole        septate cyst: 3.43 x 2.44 x 2.95 cm. Left:  Normal size of left kidney. Normal cortical thickness of the        left kidney. No evidence of left renal artery stenosis.        Increased cortial echgenicity.          Left Kidney - Multiple cysts noted. Largest - Lateral 2.64 x        2.24 x 2.28 cm simple cyst.          Left renal artery is suboptimally visualized due to overlying        bowel gas, rib shadow, and body habitus.  Renal Artery Duplex (Date: 11/16/16): Anechoic, non-vascularized cystic structure of right kidney measuring 2.1 cm in the lower pole. Cyst measured 2.6 cm on 10-22-14.   Anechoic, non vascularized cystic structure of left kidney measuring 2.4 cm in the upper pole. No evidence of significant right renal artery stenosis. Aneurysm of distal  right renal artery is observed measuring 1.22 cm x 1.20 cm, unchanged from 10-22-2014.   Medical Decision Making  The patient is a 85 y.o. female who presents with no change in the size of her small right renal artery aneurysm, remains at 1.2 cm, same as in September 2016.  No mention of cyst in left kidney in 2016 (left side not documented in 2016) Bilateral renal cysts, measurements as above.  I discussed with Dr. Myra Gianotti pt continued bilateral renal cysts on duples, last CTA abd was in 2015, he reviewed those results, no renal cysts seen. Will obtain MRI of bilateral kidneys, with contrast depending on her renal function, see me afterward on a day that Dr. Myra Gianotti is in the office.  Based on the patient's vascular studies and examination, I have offered the patient: follow up in 2 years with bilateral renal artery duplex. I advised pt and husband to work closely with her PCP to get her blood pressure under better control    Thank you for allowing [place] to participate in this patient's care.  Charisse March, RN, MSN, FNP-C [facility] [place] Office: [phone]  Clinic Physician: Myra Gianotti  12/19/2018, 10:18 AM

## 2018-12-20 ENCOUNTER — Ambulatory Visit: Payer: Medicare Other | Admitting: Neurology

## 2019-01-17 ENCOUNTER — Other Ambulatory Visit: Payer: Self-pay

## 2019-01-17 ENCOUNTER — Ambulatory Visit
Admission: RE | Admit: 2019-01-17 | Discharge: 2019-01-17 | Disposition: A | Discharge status: Home / Self Care | Payer: Medicare Other | Source: Ambulatory Visit | Attending: Surgery | Admitting: Surgery

## 2019-01-17 DIAGNOSIS — N281 Cyst of kidney, acquired: Secondary | ICD-10-CM

## 2019-01-17 MED ORDER — GADOBENATE DIMEGLUMINE 529 MG/ML IV SOLN
8.0000 mL | Freq: Once | INTRAVENOUS | Status: AC | PRN
  Administered 2019-01-17: 8 mL via INTRAVENOUS

## 2019-01-21 ENCOUNTER — Other Ambulatory Visit: Payer: Self-pay | Admitting: Neurology

## 2019-01-23 ENCOUNTER — Ambulatory Visit (INDEPENDENT_AMBULATORY_CARE_PROVIDER_SITE_OTHER): Payer: Medicare Other | Admitting: Family

## 2019-01-23 ENCOUNTER — Encounter: Payer: Self-pay | Admitting: Family

## 2019-01-23 ENCOUNTER — Other Ambulatory Visit: Payer: Self-pay

## 2019-01-23 VITALS — BP 163/77 | HR 65 | Temp 97.5°F | Resp 16 | Ht 67.0 in | Wt 181.0 lb

## 2019-01-23 DIAGNOSIS — N281 Cyst of kidney, acquired: Secondary | ICD-10-CM

## 2019-01-23 DIAGNOSIS — Z720 Tobacco use: Secondary | ICD-10-CM | POA: Diagnosis not present

## 2019-01-23 NOTE — Progress Notes (Signed)
CC: Follow up right renal artery small aneurysm   History of Present Illness  Laurie Atkinson is a 85 y.o. 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 Dr. Shanon PayorBrabhamsaw her in 2015with a CT scan. This showed that the aneurysm actually was 1.0 cm in diameter in the distal right renal artery.   She has had some injections for neck pain which has helped, not needed in several years.   Dr. Myra GianottiBrabham last evaluated pt on 11-12-14. At that time renal artery duplex demonstrated a1.2 cm aneurysm on the right renal artery Therehadbeen no significant change in the size or aneurysm. We have been following this for several years.Dr. Myra GianottiBrabham indicatedit wasthereforesafe to follow up every 2 years, next visitwith an ultrasound.  She had c-spine surgery about 2016, anterior approach.  She denies any hx of stroke or TIA.   She does have a hx of IDA with iron infusions.   Husband states she is scheduled to see a gynecologist later this month for what sounds like vaginal prolapse. I spoke with husband via cell phone, he is in his car in the [facility] parking lot.   She does not seem to have claudication issues with walking. She is being treated for dementia, husband states that she recieved home OT and PT.  The patient's blood pressure has been stable and normal at home, is elevated now.  Pt denies headache, denies chest pain or dyspnea, denies fever or chill.s  The patient's urinary history has remained stable.  DM: no Tobacco use: she states she uses snuff since she was a teen, denies any hx of smoking    Past Medical History:  Diagnosis Date  . Abnormality of gait   . Aneurysm of renal artery (HCC)   . Anxiety state, unspecified   . Arthritis    knees & neck   . Complication of anesthesia 1980's   decreased BP from medicine relative anesth. & her surgery got cancelled , but  had surgery the next week, with no problems   . Coronary artery disease   . Depression   . Diplopia   . GERD (gastroesophageal reflux disease)    HIATAL HERNIA  . H/O echocardiogram 2014   [facility] for ? indigestion, told that it was wnl  . Headache    distant history of  . History of blood transfusion    with colon surgery   . Hypercalcemia   . Hypertension   . Hypothyroidism   . Migraine   . Myocardial infarction (HCC) 1960's    ? when  . Neuropathy   . Other specified disorder of peritoneum   . Renal artery aneurysm [facility](HCC) March 2015  . Unspecified disorder of kidney and ureter     Social History Social History   Tobacco Use  . Smoking status: Never Smoker  . Smokeless tobacco: Never Used  Substance Use Topics  . Alcohol use: No  . Drug use: No    Family History Family History  Problem Relation Age of Onset  . Heart disease Mother        Heart Disease before age 83  . Stroke Mother        [id number]  . Heart attack Mother   . Cancer Father        Throat  . Diabetes Father   . Heart attack Father   . Cancer Sister  Breast, Brain, Kidney  . Heart disease Brother        Pacemaker- Before age 39  . Heart attack Brother   . Diabetes Sister     Surgical History Past Surgical History:  Procedure Laterality Date  . ABDOMINAL HYSTERECTOMY    . ANTERIOR CERVICAL DECOMP/DISCECTOMY FUSION N/A 04/05/2015   Procedure: Anterior Cervical Discectomy Decompression Fusion - Cervical three-Cervical four ;  Surgeon: Tia Alert, MD;  Location: [facility];  Service: Neurosurgery;  Laterality: N/A;  . APPENDECTOMY    . BACK SURGERY    . CARDIAC CATHETERIZATION  1960's?   told- no blockages   . CATARACT EXTRACTION W/PHACO Left 06/16/2017   Procedure: CATARACT EXTRACTION PHACO AND INTRAOCULAR LENS PLACEMENT (IOC);  Surgeon: Galen Manila, MD;  Location: [facility];  Service: Ophthalmology;  Laterality: Left;  [place] 00:46 AP% 14.1 CDE 6.56 Fluid pack lot # [id number]    . CATARACT EXTRACTION W/PHACO Right 07/27/2017   Procedure: CATARACT EXTRACTION PHACO AND INTRAOCULAR LENS PLACEMENT (IOC);  Surgeon: Galen Manila, MD;  Location: [facility] ORS;  Service: Ophthalmology;  Laterality: Right;  [place] 00:32.3 AP% 12.7 CDE 4.08 Fluid Pack Lot # [id number]  . CHOLECYSTECTOMY    . COLON SURGERY    . HAND SURGERY     thumb joint replacement   . HERNIA REPAIR  04/2012   Umbilical/ HIATAL  . TONSILLECTOMY      Allergies  Allergen Reactions  . Morphine And Related Hives  . Oxycodone Itching  . Percocet [Oxycodone-Acetaminophen] Itching  . Ambien [Zolpidem] Anxiety    Current Outpatient Medications  Medication Sig Dispense Refill  . acetaminophen (TYLENOL) 500 MG tablet Take 1,000 mg by mouth every 6 (six) hours as needed for moderate pain or headache.     . Apoaequorin (PREVAGEN PO) Take 1 tablet by mouth daily.    Marland Kitchen aspirin 81 MG tablet Take 81 mg by mouth at bedtime.     . diphenhydrAMINE (BENADRYL) 25 mg capsule Take 25 mg by mouth daily as needed for allergies.     Marland Kitchen gabapentin (NEURONTIN) 300 MG capsule Take 1 capsule (300 mg total) by mouth 3 (three) times daily. 90 capsule 11  . levothyroxine (SYNTHROID, LEVOTHROID) 50 MCG tablet TAKE 1 TABLET BY MOUTH ONCE DAILY 30 tablet 3  . memantine (NAMENDA) 10 MG tablet Take 1 tablet (10 mg total) by mouth 2 (two) times daily. 60 tablet 11  . Multiple Vitamins-Minerals (CENTRUM SILVER 50+WOMEN PO) Take 1 tablet by mouth daily.    Marland Kitchen olmesartan (BENICAR) 20 MG tablet Take 20 mg by mouth daily.    Marland Kitchen omeprazole (PRILOSEC) 20 MG capsule Take 20 mg by mouth daily.    . Probiotic Product (PROBIOTIC DAILY PO) Take 1 capsule by mouth 2 (two) times daily.     . sodium chloride (OCEAN) 0.65 % SOLN nasal spray Place 2 sprays into both nostrils at bedtime as needed for congestion.     . traMADol (ULTRAM) 50 MG tablet Take 1 tablet (50 mg total) by mouth every 6 (six) hours as needed. 30 tablet 5   No current  facility-administered medications for this visit.    REVIEW OF SYSTEMS: see HPI for pertinent positives and negatives    Physical Examination  Vitals:   01/23/19 1117  BP: (!) 163/77  Pulse: 65  Resp: 16  Temp: (!) 97.5 F (36.4 C)  TempSrc: Oral  SpO2: 99%  Weight: 181 lb (82.1 kg)  Height:  (1.702 m)   Body  mass index is 28.35 kg/m.   General: The patient appears her stated age.   HEENT:  No gross abnormalities Pulmonary: Respirations are non-labored, fair air movement in all fields Abdomen: Soft and non-tender with normal pitched bowel sounds. Musculoskeletal: There are no major deformities.   Neurologic: No focal weakness or paresthesias are detected, M/S 5/5 in upper extremities, 3/5 in lower extremities.  Oriented x3.  Skin: There are no ulcer or rashes noted.  Psychiatric: The patient has normal affect. Cardiovascular: There is a regular rate and rhythm without significant murmur appreciated.    DATA  MR abdomen with and w/o contrast (01-17-19): Hepatobiliary: 1.7 x 1.3 cm subcapsular lesion is identified in the anterior right liver along the gallbladder fossa (see postcontrast image 23 of series 12. This lesion has intermediate signal intensity on T2 imaging with intermediate to mild increased signal on T1 precontrast imaging. Subtraction imaging confirms diffuse enhancement within the lesion with an apparent rim of low signal intensity. This lesion was visible on a CT without contrast of 09/21/2026, but was not visible on MRI of 06/09/2018 or CT of 05/18/2018.  Gallbladder surgically absent. No intrahepatic or extrahepatic biliary dilation.  Pancreas: No focal mass lesion. No dilatation of the main duct. No intraparenchymal cyst. No peripancreatic edema.  Spleen:  Visualized portions of the spleen are unremarkable.  Adrenals/Urinary Tract: Right adrenal gland unremarkable. Tiny left adrenal nodule is stable since 10/17/2017 compatible with  adenoma.  Multiple simple cysts are identified in the right kidney, measuring up to 3.5 cm maximum diameter. No suspicious enhancing abnormality in the right kidney.  Areas of cortical scarring noted left kidney with multiple simple cysts evident. Dominant left renal cyst measures 2.7 cm in the upper pole region. This lesion has a thin smooth septation along the inferior margin. No suspicious enhancing left renal lesion.  Stomach/Bowel: Stomach is unremarkable. No gastric wall thickening. No evidence of outlet obstruction. Duodenum is normally positioned as is the ligament of Treitz. No small bowel or colonic dilatation within the visualized abdomen.  Vascular/Lymphatic: No abdominal aortic aneurysm. No abdominal lymphadenopathy  Other:  No intraperitoneal free fluid.  Musculoskeletal: No abnormal marrow enhancement within the visualized bony anatomy.  IMPRESSION: 1. 1.7 x 1.3 cm subcapsular lesion in the anterior right liver is indeterminate. This has a hypointense rim and some T1 shortening on precontrast imaging, but the diffuse enhancement seen on postcontrast imaging would make hematoma unlikely. Vascular malformation would be a possibility. As noted above, this lesion is discernible on a noncontrast CT of 09/21/2018 but is not visible on a previous MR of 06/09/2018 nor previous noncontrast CT of 05/18/2018. On the CT scan of 09/21/2018, the patient was noted to have free air from perforation, although I have no clinical history related to that admission. If the patient has a history of cancer, metastatic disease must be considered. Recommend follow-up MRI in 3 months to re-evaluate and assess stability. 2. Bilateral renal cysts. No suspicious renal lesion on today's MRI. 3. Tiny left adrenal adenoma.    Bilateral Renal Artery Duplex (12-19-18): Right: Normal size right kidney. Normal cortical thickness of right kidney. No evidence of right renal artery  stenosis. Distal right renal artery aneurysm measuring 1.18 x 1.21 cm. Previously measured 1.22 x 1.20 cm on prior exam 11/16/2016. No siginifcant change.  Right renal artery is mildly tortuous.  Right kidney - Multiple cysts noted. Largest - Inferior pole septate cyst: 3.43 x 2.44 x 2.95 cm. Left: Normal size of left kidney. Normal  cortical thickness of the left kidney. No evidence of left renal artery stenosis. Increased cortial echgenicity.  Left Kidney - Multiple cysts noted. Largest - Lateral 2.64 x 2.24 x 2.28 cm simple cyst.  Left renal artery is suboptimally visualized due to overlying bowel gas, rib shadow, and body habitus.  RenalArteryDuplex(Date:11/16/16): Anechoic, non-vascularized cystic structure of right kidney measuring 2.1 cm in the lower pole. Cyst measured 2.6 cm on 10-22-14.  Anechoic, non vascularized cystic structure of left kidney measuring 2.4 cm in the upper pole. No evidence of significant right renal artery stenosis. Aneurysm of distal right renal artery is observed measuring 1.22 cm x 1.20 cm, unchanged from 10-22-2014.   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  MR of abdomen done on 01-17-19 to evaluate new cysts or lesions seen on abdominal duplex last month to evaluate renal artery aneurysm.    MR Abdomen, 01-17-19: 1.7 x 1.3 cm subcapsular lesion in the anterior right liver is indeterminate. This has a hypointense rim and some T1 shortening on precontrast imaging, but the diffuse enhancement seen on postcontrast imaging would make hematoma unlikely. Vascular malformation would be a possibility.  If the patient has a history of cancer, metastatic disease must be considered. Recommend follow-up MRI in 3 months to re-evaluate and assess stability.  Will schedule MR of abdomen with contrast in 3 months,  virtual visit follow up with PA or Dr. Myra Gianotti after MR.   Charisse March, RN, MSN, FNP-C [facility] Office: [phone]  01/23/2019, 11:40 AM  Clinic MD:  Myra Gianotti

## 2019-02-09 ENCOUNTER — Other Ambulatory Visit: Payer: Self-pay | Admitting: *Deleted

## 2019-03-27 ENCOUNTER — Other Ambulatory Visit: Payer: Self-pay

## 2019-03-27 DIAGNOSIS — K769 Liver disease, unspecified: Secondary | ICD-10-CM

## 2019-04-06 ENCOUNTER — Other Ambulatory Visit: Payer: Self-pay

## 2019-04-06 DIAGNOSIS — K769 Liver disease, unspecified: Secondary | ICD-10-CM

## 2019-04-13 ENCOUNTER — Telehealth: Payer: Self-pay | Admitting: Neurology

## 2019-04-13 NOTE — Telephone Encounter (Signed)
Pt's daughter Lynden Ang on [place] called wanting to speak to RN about her mothers memory and how it is getting worse. Please advise.

## 2019-04-13 NOTE — Telephone Encounter (Signed)
Attempted to reach daughter, Lynden Ang and advise she have mother's PCP refer her to Dr Terrace Arabia for memory evaluation. Dr Terrace Arabia has seen her for headaches, paresthesias and gait difficulty. Phone staff may inform daughter if she calls back. I was unable to get through or LVM.

## 2019-04-18 NOTE — Telephone Encounter (Signed)
Informed pt daughter vicki of below message

## 2019-04-21 ENCOUNTER — Ambulatory Visit (HOSPITAL_COMMUNITY): Admission: RE | Admit: 2019-04-21 | Payer: Medicare Other | Source: Ambulatory Visit

## 2019-04-24 ENCOUNTER — Ambulatory Visit: Payer: Medicare Other | Admitting: Surgery

## 2019-05-09 ENCOUNTER — Other Ambulatory Visit: Payer: Self-pay

## 2019-05-09 ENCOUNTER — Ambulatory Visit (HOSPITAL_COMMUNITY)
Admission: RE | Admit: 2019-05-09 | Discharge: 2019-05-09 | Disposition: A | Discharge status: Home / Self Care | Payer: Medicare Other | Source: Ambulatory Visit | Attending: Surgery | Admitting: Surgery

## 2019-05-09 DIAGNOSIS — K769 Liver disease, unspecified: Secondary | ICD-10-CM | POA: Diagnosis not present

## 2019-05-09 MED ORDER — GADOBUTROL 1 MMOL/ML IV SOLN
8.0000 mL | Freq: Once | INTRAVENOUS | Status: AC | PRN
  Administered 2019-05-09: 16:00:00 8 mL via INTRAVENOUS

## 2019-05-15 ENCOUNTER — Ambulatory Visit: Payer: Medicare Other | Admitting: Surgery

## 2019-05-25 ENCOUNTER — Ambulatory Visit (INDEPENDENT_AMBULATORY_CARE_PROVIDER_SITE_OTHER): Payer: Medicare Other | Admitting: Neurology

## 2019-05-25 ENCOUNTER — Other Ambulatory Visit: Payer: Self-pay

## 2019-05-25 ENCOUNTER — Encounter: Payer: Self-pay | Admitting: Neurology

## 2019-05-25 VITALS — BP 134/72 | HR 80 | Temp 97.2°F | Ht 67.0 in | Wt 190.5 lb

## 2019-05-25 DIAGNOSIS — R269 Unspecified abnormalities of gait and mobility: Secondary | ICD-10-CM

## 2019-05-25 DIAGNOSIS — G3184 Mild cognitive impairment, so stated: Secondary | ICD-10-CM | POA: Diagnosis not present

## 2019-05-25 DIAGNOSIS — R519 Headache, unspecified: Secondary | ICD-10-CM

## 2019-05-25 MED ORDER — DONEPEZIL HCL 10 MG PO TABS: 10.0000 mg | ORAL_TABLET | Freq: Every day | ORAL | 11 refills | Status: DC

## 2019-05-25 MED ORDER — VERAPAMIL HCL ER 180 MG PO TBCR: 180.0000 mg | EXTENDED_RELEASE_TABLET | Freq: Every day | ORAL | 11 refills | Status: DC

## 2019-05-25 NOTE — Progress Notes (Signed)
PATIENT: Laurie Atkinson DOB: 07-Nov-1933  Chief Complaint  Patient presents with  . Memory Loss    MMSE 29/30 - 8 animals. She is here with her husband, Laurie Atkinson, to have her memory loss evaluated.   . Paresthesia/Headaches    She is still having numbness/tingling in her feet. Headaches have improved and reduced in frequency.   Marland Kitchen PCP    Angelina Sheriff, MD     HISTORICAL  Laurie Atkinson is a 86 year old female, seen in request by her primary care physician Dr. Lin Landsman, [name].  Evaluation of chronic headache, initial evaluation was on October 18, 2018.  She is accompanied by her daughter Laurie Atkinson at today's clinical visit.  I have reviewed and summarized the referring note from the referring physician.  She had past medical history of hypertension, hypothyroidism, on supplement, acid reflux disease, peptic ulcer disease with history of perforation,  She reported a history of migraine headaches, her typical migraine are moderate to severe holoacranial severe pounding headache with associated light noise sensitivity, she has stopped having frequent migraine for a while  But since beginning of 2020, she began to have frequent headaches again, bilateral temporal region, ringing in her ears, also complains of neck pain, she is having frequent headaches, mild degree on a daily basis, couple times each week, she has to take Tylenol for headache  Daughter also reported that patient has gradual onset memory loss, increased confusion during UTI, she also complains of few years history of bilateral lower extremity bilateral feet numbness tingling, slow worsening gait abnormality, she has neck pain, radiating pain to left shoulder, she denies significant low back pain.  I personally reviewed CT cervical without contrast February 2017: Multilevel cervical degenerative changes, there was no significant canal stenosis, variable degree of foraminal narrowing  Laboratory evaluation in 2020, CBC showed  hemoglobin of 11.7, normal TSH, CMP, B12 of 558  UPDATE Nov 4th 2020: She return for electrodiagnostic study today, which showed no significant abnormality, there is no evidence of large fiber peripheral neuropathy or bilateral lumbosacral radiculopathy  Laboratory evaluations showed normal ESR, C-reactive protein  She continue complains of frequent bilateral frontal headaches, similar to her migraine in the past, lasting couple hours, couple times each week, Aleve provide partial help  She also complains of bilateral feet paresthesia, tolerating gabapentin 300 mg every night,  Husband also reported she has gradual worsening mild memory loss, mildly unsteady gait, I personally reviewed MRI of the brain without contrast in October 2020: Mild supratentorium small vessel disease, mild generalized atrophy, no acute abnormality, single chronic microhemorrhage in the left frontal lobe.  UPDATE May 25 2019: She is accompanied by her husband at today's visit, continue complains of fatigue, daytime sleepiness, poor sleeping quality at nighttime, taking frequent naps during the day, also complains of worsening memory loss, frequent bilateral frontal temporal region headaches   we have personally reviewed MRI of the brain October 2020, generalized atrophy, mild to moderate supratentorium small vessel disease, chronic microhemorrhage at the left frontal lobe, no acute abnormality  Previous laboratory evaluation also showed normal ESR, C-reactive protein,  She has mild improvement with physical therapy  REVIEW OF SYSTEMS: Full 14 system review of systems performed and notable only for as above All other review of systems were negative.  ALLERGIES: Allergies  Allergen Reactions  . Morphine And Related Hives  . Oxycodone Itching  . Percocet [Oxycodone-Acetaminophen] Itching  . Ambien [Zolpidem] Anxiety    HOME MEDICATIONS: Current Outpatient  Medications  Medication Sig Dispense Refill  .  acetaminophen (TYLENOL) 500 MG tablet Take 1,000 mg by mouth every 6 (six) hours as needed for moderate pain or headache.     . Apoaequorin (PREVAGEN PO) Take 1 tablet by mouth daily.    Marland Kitchen aspirin 81 MG tablet Take 81 mg by mouth at bedtime.     . diphenhydrAMINE (BENADRYL) 25 mg capsule Take 25 mg by mouth daily as needed for allergies.     Marland Kitchen gabapentin (NEURONTIN) 300 MG capsule Take 1 capsule (300 mg total) by mouth 3 (three) times daily. 90 capsule 11  . levothyroxine (SYNTHROID, LEVOTHROID) 50 MCG tablet TAKE 1 TABLET BY MOUTH ONCE DAILY 30 tablet 3  . memantine (NAMENDA) 10 MG tablet TAKE 1 TABLET BY MOUTH TWICE A DAY 180 tablet 4  . Multiple Vitamins-Minerals (CENTRUM SILVER 50+WOMEN PO) Take 1 tablet by mouth daily.    Marland Kitchen olmesartan (BENICAR) 20 MG tablet Take 20 mg by mouth daily.    Marland Kitchen omeprazole (PRILOSEC) 20 MG capsule Take 20 mg by mouth daily.    . Probiotic Product (PROBIOTIC DAILY PO) Take 1 capsule by mouth 2 (two) times daily.     . sodium chloride (OCEAN) 0.65 % SOLN nasal spray Place 2 sprays into both nostrils at bedtime as needed for congestion.     . traMADol (ULTRAM) 50 MG tablet Take 1 tablet (50 mg total) by mouth every 6 (six) hours as needed. 30 tablet 5   No current facility-administered medications for this visit.    PAST MEDICAL HISTORY: Past Medical History:  Diagnosis Date  . Abnormality of gait   . Aneurysm of renal artery (HCC)   . Anxiety state, unspecified   . Arthritis    knees & neck   . Complication of anesthesia 1980's   decreased BP from medicine relative anesth. & her surgery got cancelled , but had surgery the next week, with no problems   . Coronary artery disease   . Depression   . Diplopia   . GERD (gastroesophageal reflux disease)    HIATAL HERNIA  . H/O echocardiogram 2014   [facility] for ? indigestion, told that it was wnl  . Headache    distant history of  . History of blood transfusion    with colon surgery   . Hypercalcemia    . Hypertension   . Hypothyroidism   . Memory loss   . Migraine   . Myocardial infarction ([place]) 1960's    ? when  . Neuropathy   . Other specified disorder of peritoneum   . Renal artery aneurysm [facility]) March 2015  . Unspecified disorder of kidney and ureter     PAST SURGICAL HISTORY: Past Surgical History:  Procedure Laterality Date  . ABDOMINAL HYSTERECTOMY    . ANTERIOR CERVICAL DECOMP/DISCECTOMY FUSION N/A 04/05/2015   Procedure: Anterior Cervical Discectomy Decompression Fusion - Cervical three-Cervical four ;  Surgeon: Eustace Moore, MD;  Location: [facility];  Service: Neurosurgery;  Laterality: N/A;  . APPENDECTOMY    . BACK SURGERY    . CARDIAC CATHETERIZATION  1960's?   told- no blockages   . CATARACT EXTRACTION W/PHACO Left 06/16/2017   Procedure: CATARACT EXTRACTION PHACO AND INTRAOCULAR LENS PLACEMENT (IOC);  Surgeon: Birder Robson, MD;  Location: [facility];  Service: Ophthalmology;  Laterality: Left;  [place] 00:46 AP% 14.1 CDE 6.56 Fluid pack lot # [id number]  . CATARACT EXTRACTION W/PHACO Right 07/27/2017   Procedure: CATARACT EXTRACTION PHACO AND  INTRAOCULAR LENS PLACEMENT (IOC);  Surgeon: Birder Robson, MD;  Location: [facility];  Service: Ophthalmology;  Laterality: Right;  [place] 00:32.3 AP% 12.7 CDE 4.08 Fluid Pack Lot # [id number]  . CHOLECYSTECTOMY    . COLON SURGERY    . HAND SURGERY     thumb joint replacement   . HERNIA REPAIR  0/9735   Umbilical/ HIATAL  . TONSILLECTOMY      FAMILY HISTORY: Family History  Problem Relation Age of Onset  . Heart disease Mother        Heart Disease before age 70  . Stroke Mother        80  . Heart attack Mother   . Cancer Father        Throat  . Diabetes Father   . Heart attack Father   . Cancer Sister        Breast, Brain, Kidney  . Heart disease Brother        Pacemaker- Before age 80  . Heart attack Brother   . Diabetes Sister     SOCIAL HISTORY: Social History   Socioeconomic History  . Marital  status: Married    Spouse name: Not on file  . Number of children: 3  . Years of education: 15  . Highest education level: High school graduate  Occupational History  . Occupation: Retired  Tobacco Use  . Smoking status: Never Smoker  . Smokeless tobacco: Never Used  Substance and Sexual Activity  . Alcohol use: No  . Drug use: No  . Sexual activity: Not on file  Other Topics Concern  . Not on file  Social History Narrative   Lives at home with her husband.   Right-handed.   Two 16-oz bottles of soda per day.    Social Determinants of Health   Financial Resource Strain:   . Difficulty of Paying Living Expenses:   Food Insecurity:   . Worried About Charity fundraiser in the Last Year:   . Arboriculturist in the Last Year:   Transportation Needs:   . Film/video editor (Medical):   Marland Kitchen Lack of Transportation (Non-Medical):   Physical Activity:   . Days of Exercise per Week:   . Minutes of Exercise per Session:   Stress:   . Feeling of Stress :   Social Connections:   . Frequency of Communication with Friends and Family:   . Frequency of Social Gatherings with Friends and Family:   . Attends Religious Services:   . Active Member of Clubs or Organizations:   . Attends Archivist Meetings:   Marland Kitchen Marital Status:   Intimate Partner Violence:   . Fear of Current or Ex-Partner:   . Emotionally Abused:   Marland Kitchen Physically Abused:   . Sexually Abused:      PHYSICAL EXAM   Vitals:   05/25/19 1404  BP: 134/72  Pulse: 80  Temp: (!) 97.2 F (36.2 C)  Weight: 190 lb 8 oz (86.4 kg)  Height: '5\' 7"'  (1.702 m)    Not recorded      Body mass index is 29.84 kg/m.  PHYSICAL EXAMNIATION:  Gen: NAD, conversant, well nourised, well groomed                     Cardiovascular: Regular rate rhythm, no peripheral edema, warm, nontender. Eyes: Conjunctivae clear without exudates or hemorrhage Neck: Supple, no carotid bruits. Pulmonary: Clear to auscultation  bilaterally   NEUROLOGICAL EXAM:  MENTAL STATUS: Tired looking elderly female MMSE - Mini Mental State Exam 05/25/2019  Orientation to time 5  Orientation to Place 5  Registration 3  Attention/ Calculation 5  Recall 2  Language- name 2 objects 2  Language- repeat 1  Language- follow 3 step command 3  Language- read & follow direction 1  Write a sentence 1  Copy design 1  Total score 29  Animal naming 8   CRANIAL NERVES: CN II: Visual fields are full to confrontation.  Pupils are round equal and briskly reactive to light. CN III, IV, VI: extraocular movement are normal. No ptosis. CN V: Facial sensation is intact to pinprick in all 3 divisions bilaterally. Corneal responses are intact.  CN VII: Face is symmetric with normal eye closure and smile. CN VIII: Hearing is normal to causal conversation. CN IX, X: Palate elevates symmetrically. Phonation is normal. CN XI: Head turning and shoulder shrug are intact CN XII: Tongue is midline with normal movements and no atrophy.  MOTOR: There is no pronator drift of out-stretched arms. Muscle bulk and tone are normal. Muscle strength is normal.  REFLEXES: Reflexes are 1 and symmetric at the biceps, triceps, knees, and ankles. Plantar responses are flexor.  SENSORY: Length dependent decreased to light touch, pinprick, and vibratory sensation to ankle level  COORDINATION: Rapid alternating movements and fine finger movements are intact. There is no dysmetria on finger-to-nose and heel-knee-shin.    GAIT/STANCE: She needs push-up to get up from seated position, leaning forward, cautious, mildly unsteady.  DIAGNOSTIC DATA (LABS, IMAGING, TESTING) - I reviewed patient records, labs, notes, testing and imaging myself where available.   ASSESSMENT AND PLAN  SHANDRIKA AMBERS is a 86 y.o. female  Short-term memory loss  Mini-Mental Status Examination 29/30  MRI of the brain showed generalized atrophy, supratentorium small vessel  disease,  Previous laboratory evaluation showed normal TSH, B12, no treatable etiology found  Continue Namenda 10 mg twice a day  Add on Aricept 10 mg daily  I also encouraged her moderate exercise  Worsening frequent headaches  History of migraine  ESR C-reactive protein was normal, no evidence of temporal arteritis  Add on verapamil SR 180 mg every night as preventive medication  tramadol as needed  Gait abnormality  Likely multifactorial, deconditioning, joint pain,  EMG nerve conduction study in November 2020 showed no evidence of large fiber peripheral neuropathy or bilateral lumbosacral radiculopathy,    Marcial Pacas, M.D. Ph.D.  [facility] [address] [place], [place] [address] Ph: [phone] Fax: [phone]  CC: Angelina Sheriff, MD

## 2019-06-02 ENCOUNTER — Telehealth (HOSPITAL_COMMUNITY): Payer: Self-pay

## 2019-06-02 NOTE — Telephone Encounter (Signed)

## 2019-06-05 ENCOUNTER — Ambulatory Visit: Payer: Medicare Other | Admitting: Surgery

## 2019-06-05 NOTE — Telephone Encounter (Addendum)
I called her daughter, Lynden Ang (on [place]). She wanted to know the purpose of BP medication. She understands that verapamil was prescribed for frequent headaches. She also wanted to confirm her memory medications. She was already on memantine and donepezil was added at her last visit. She also understands that her memory loss is being closely monitored and that the medication is to slow the progression.

## 2019-06-06 ENCOUNTER — Ambulatory Visit: Payer: Medicare Other | Admitting: Neurology

## 2019-07-10 ENCOUNTER — Other Ambulatory Visit: Payer: Self-pay

## 2019-07-10 ENCOUNTER — Ambulatory Visit (INDEPENDENT_AMBULATORY_CARE_PROVIDER_SITE_OTHER): Payer: Medicare Other | Admitting: Surgery

## 2019-07-10 ENCOUNTER — Encounter: Payer: Self-pay | Admitting: Surgery

## 2019-07-10 VITALS — BP 143/77 | HR 60 | Temp 98.0°F | Resp 20 | Ht 67.0 in | Wt 191.0 lb

## 2019-07-10 DIAGNOSIS — I722 Aneurysm of renal artery: Secondary | ICD-10-CM

## 2019-07-10 NOTE — Progress Notes (Signed)
[facility] [place]  Patient name: Laurie Atkinson MRN: [id number] DOB: 1933/08/03 Sex: female   REASON FOR VISIT:    Follow up  HISOTRY OF PRESENT ILLNESS:    The patient is back for follow-up of a 1.5 cm right renal artery aneurysm that was initially detected on CT scan incidentally in 2014.  I saw her 1 year ago with a CT scan.  This showed that the aneurysm actually was 1.0 cm in diameter in the distal right renal artery.  She has been followed by our nurse practitioner.  Recently she is found to have bilateral renal cysts which were new finding.  Therefore she was sent for MRI.  This showed a liver lesion for which follow-up was recommended.  She had a 90-month follow-up and is back today to discuss these results.  Since I last saw her, she has had some injections for neck pain which has helped.  She has also had improvement of her blood pressure so that she has stopped taking her metoprolol and only takes an ACE inhibitor.  PAST MEDICAL HISTORY:   Past Medical History:  Diagnosis Date   Abnormality of gait    Aneurysm of renal artery (HCC)    Anxiety state, unspecified    Arthritis    knees & neck    Complication of anesthesia 1980's   decreased BP from medicine relative anesth. & her surgery got cancelled , but had surgery the next week, with no problems    Coronary artery disease    Depression    Diplopia    GERD (gastroesophageal reflux disease)    HIATAL HERNIA   H/O echocardiogram 2014   [facility] for ? indigestion, told that it was wnl   Headache    distant history of   History of blood transfusion    with colon surgery    Hypercalcemia    Hypertension    Hypothyroidism    Memory loss    Migraine    Myocardial infarction [facility]) 1960's    ? when   Neuropathy    Other specified disorder of peritoneum    Renal artery aneurysm [facility]) March 2015   Unspecified disorder of kidney  and ureter      FAMILY HISTORY:   Family History  Problem Relation Age of Onset   Heart disease Mother        Heart Disease before age 53   Stroke Mother        65   Heart attack Mother    Cancer Father        Throat   Diabetes Father    Heart attack Father    Cancer Sister        Breast, Brain, Kidney   Heart disease Brother        Pacemaker- Before age 77   Heart attack Brother    Diabetes Sister     SOCIAL HISTORY:   Social History   Tobacco Use   Smoking status: Never Smoker   Smokeless tobacco: Never Used  Substance Use Topics   Alcohol use: No     ALLERGIES:   Allergies  Allergen Reactions   Morphine And Related Hives   Oxycodone Itching   Percocet [Oxycodone-Acetaminophen] Itching   Ambien [Zolpidem] Anxiety     CURRENT MEDICATIONS:   Current Outpatient Medications  Medication Sig Dispense Refill   acetaminophen (TYLENOL) 500 MG tablet Take 1,000 mg by mouth every 6 (six) hours as needed for moderate  pain or headache.      Apoaequorin (PREVAGEN PO) Take 1 tablet by mouth daily.     aspirin 81 MG tablet Take 81 mg by mouth at bedtime.      diphenhydrAMINE (BENADRYL) 25 mg capsule Take 25 mg by mouth daily as needed for allergies.      donepezil (ARICEPT) 10 MG tablet Take 1 tablet (10 mg total) by mouth at bedtime. 30 tablet 11   gabapentin (NEURONTIN) 300 MG capsule Take 1 capsule (300 mg total) by mouth 3 (three) times daily. 90 capsule 11   levothyroxine (SYNTHROID, LEVOTHROID) 50 MCG tablet TAKE 1 TABLET BY MOUTH ONCE DAILY 30 tablet 3   memantine (NAMENDA) 10 MG tablet TAKE 1 TABLET BY MOUTH TWICE A DAY 180 tablet 4   Multiple Vitamins-Minerals (CENTRUM SILVER 50+WOMEN PO) Take 1 tablet by mouth daily.     olmesartan (BENICAR) 20 MG tablet Take 20 mg by mouth daily.     omeprazole (PRILOSEC) 20 MG capsule Take 20 mg by mouth daily.     Probiotic Product (PROBIOTIC DAILY PO) Take 1 capsule by mouth 2 (two) times  daily.      sodium chloride (OCEAN) 0.65 % SOLN nasal spray Place 2 sprays into both nostrils at bedtime as needed for congestion.      traMADol (ULTRAM) 50 MG tablet Take 1 tablet (50 mg total) by mouth every 6 (six) hours as needed. 30 tablet 5   verapamil (CALAN-SR) 180 MG CR tablet Take 1 tablet (180 mg total) by mouth at bedtime. 30 tablet 11   No current facility-administered medications for this visit.    REVIEW OF SYSTEMS:   [X]  denotes positive finding, [ ]  denotes negative finding Cardiac  Comments:  Chest pain or chest pressure:    Shortness of breath upon exertion:    Short of breath when lying flat:    Irregular heart rhythm:        Vascular    Pain in calf, thigh, or hip brought on by ambulation:    Pain in feet at night that wakes you up from your sleep:  x   Blood clot in your veins:    Leg swelling:         Pulmonary    Oxygen at home:    Productive cough:     Wheezing:         Neurologic    Sudden weakness in arms or legs:     Sudden numbness in arms or legs:     Sudden onset of difficulty speaking or slurred speech:    Temporary loss of vision in one eye:     Problems with dizziness:         Gastrointestinal    Blood in stool:     Vomited blood:         Genitourinary    Burning when urinating:     Blood in urine:        Psychiatric    Major depression:         Hematologic    Bleeding problems:    Problems with blood clotting too easily:        Skin    Rashes or ulcers:        Constitutional    Fever or chills:      PHYSICAL EXAM:   Vitals:   07/10/19 1351  BP: (!) 143/77  Pulse: 60  Resp: 20  Temp: 98 F (36.7 C)  SpO2: 97%  Weight: 191 lb (86.6 kg)  Height: 5\' 7"  (1.702 m)    GENERAL: The patient is a well-nourished female, in no acute distress. The vital signs are documented above. CARDIAC: There is a regular rate and rhythm.  PULMONARY: Non-labored respirations ABDOMEN: Soft and non-tender  MUSCULOSKELETAL: There are  no major deformities or cyanosis. NEUROLOGIC: No focal weakness or paresthesias are detected. SKIN: There are no ulcers or rashes noted. PSYCHIATRIC: The patient has a normal affect.  STUDIES:   I have reviewed the following: MRI: 1. Hypervascular focus is unchanged along the anterior right hemi liver. Perhaps this represents a vascular shunt lesion at the site of previous liver trauma or partial hepatic resection. Correlate clinically. This area has an atypical appearance for metastatic disease. Given the marked hyperenhancement would suggest focused ultrasound with real-time color Doppler and spectral Doppler imaging. This could help to exclude a high flow lesion that would be a risk to this patient in terms of future rupture or bleed. The lesion does not behave as would be expected for a malignant process given stability and given lack of washout, ultrasound may help to guide further follow-up. Specify on exam "color and spectral Doppler assessment of focal liver lesion" . 2. Mild hepatic steatosis. 3. Bilateral renal cysts with renal cortical scarring. 4. Status post cholecystectomy without signs of ductal dilation.  RENAL DUPLEX: Renal:    Right: Normal size right kidney. Normal cortical thickness of right     kidney. No evidence of right renal artery stenosis. Distal     right renal artery aneurysm measuring 1.18 x 1.21 cm.     Previously measured 1.22 x 1.20 cm on prior exam 11/16/2016.     No siginifcant change.       Right renal artery is mildly tortuous.       Right kidney - Multiple cysts noted. Largest - Inferior pole     septate cyst: 3.43 x 2.44 x 2.95 cm.  Left: Normal size of left kidney. Normal cortical thickness of the     left kidney. No evidence of left renal artery stenosis.     Increased cortial echgenicity.       Left Kidney - Multiple cysts noted. Largest - Lateral 2.64 x     2.24 x 2.28 cm simple  cyst.       Left renal artery is suboptimally visualized due to overlying     bowel gas, rib shadow, and body habitus.  MEDICAL ISSUES:   Renal aneurysm: I will repeat a CT angiogram in 2 years.  If this shows no further growth, I will not continue to follow this.  Hepatic lesion: This appears to be hypervascular with hyperenhancement.  A focused ultrasound with color and spectral Doppler has been recommended by radiology to determine the risk of bleeding.  I have scheduled this.  Further recommendations will be based on these findings.    Leia Alf, MD, FACS [facility] [phone] Pager [phone]

## 2019-07-11 DIAGNOSIS — D519 Vitamin B12 deficiency anemia, unspecified: Secondary | ICD-10-CM | POA: Diagnosis not present

## 2019-07-11 DIAGNOSIS — M8580 Other specified disorders of bone density and structure, unspecified site: Secondary | ICD-10-CM | POA: Diagnosis not present

## 2019-07-11 DIAGNOSIS — M8589 Other specified disorders of bone density and structure, multiple sites: Secondary | ICD-10-CM | POA: Diagnosis not present

## 2019-07-17 DIAGNOSIS — E538 Deficiency of other specified B group vitamins: Secondary | ICD-10-CM | POA: Diagnosis not present

## 2019-07-26 DIAGNOSIS — E538 Deficiency of other specified B group vitamins: Secondary | ICD-10-CM | POA: Diagnosis not present

## 2019-08-01 DIAGNOSIS — E538 Deficiency of other specified B group vitamins: Secondary | ICD-10-CM | POA: Diagnosis not present

## 2019-08-08 DIAGNOSIS — D509 Iron deficiency anemia, unspecified: Secondary | ICD-10-CM | POA: Diagnosis not present

## 2019-08-15 DIAGNOSIS — E538 Deficiency of other specified B group vitamins: Secondary | ICD-10-CM | POA: Diagnosis not present

## 2019-08-30 DIAGNOSIS — H43813 Vitreous degeneration, bilateral: Secondary | ICD-10-CM | POA: Diagnosis not present

## 2019-09-15 DIAGNOSIS — E538 Deficiency of other specified B group vitamins: Secondary | ICD-10-CM | POA: Diagnosis not present

## 2019-11-10 DIAGNOSIS — Z20822 Contact with and (suspected) exposure to covid-19: Secondary | ICD-10-CM | POA: Diagnosis not present

## 2019-11-16 DIAGNOSIS — N3090 Cystitis, unspecified without hematuria: Secondary | ICD-10-CM | POA: Diagnosis not present

## 2019-11-16 DIAGNOSIS — Z7689 Persons encountering health services in other specified circumstances: Secondary | ICD-10-CM | POA: Diagnosis not present

## 2019-11-16 DIAGNOSIS — K5010 Crohn's disease of large intestine without complications: Secondary | ICD-10-CM | POA: Diagnosis not present

## 2019-11-16 DIAGNOSIS — Z9049 Acquired absence of other specified parts of digestive tract: Secondary | ICD-10-CM | POA: Diagnosis not present

## 2019-11-21 DIAGNOSIS — I10 Essential (primary) hypertension: Secondary | ICD-10-CM | POA: Diagnosis not present

## 2019-11-21 DIAGNOSIS — K644 Residual hemorrhoidal skin tags: Secondary | ICD-10-CM | POA: Diagnosis not present

## 2019-11-21 DIAGNOSIS — K5909 Other constipation: Secondary | ICD-10-CM | POA: Diagnosis not present

## 2019-11-28 ENCOUNTER — Ambulatory Visit: Payer: Medicare Other | Admitting: Neurology

## 2019-11-28 ENCOUNTER — Encounter: Payer: Self-pay | Admitting: Neurology

## 2019-11-28 ENCOUNTER — Other Ambulatory Visit: Payer: Self-pay

## 2019-11-28 VITALS — BP 124/70 | HR 74 | Ht 67.0 in | Wt 195.6 lb

## 2019-11-28 DIAGNOSIS — R519 Headache, unspecified: Secondary | ICD-10-CM | POA: Diagnosis not present

## 2019-11-28 DIAGNOSIS — R269 Unspecified abnormalities of gait and mobility: Secondary | ICD-10-CM | POA: Diagnosis not present

## 2019-11-28 DIAGNOSIS — R413 Other amnesia: Secondary | ICD-10-CM | POA: Diagnosis not present

## 2019-11-28 MED ORDER — MEMANTINE HCL 10 MG PO TABS: 10.0000 mg | ORAL_TABLET | Freq: Two times a day (BID) | ORAL | 4 refills | Status: DC

## 2019-11-28 MED ORDER — DONEPEZIL HCL 10 MG PO TABS: 10.0000 mg | ORAL_TABLET | Freq: Every day | ORAL | 11 refills | Status: DC

## 2019-11-28 MED ORDER — GABAPENTIN 300 MG PO CAPS: 300.0000 mg | ORAL_CAPSULE | Freq: Three times a day (TID) | ORAL | 11 refills | Status: DC

## 2019-11-28 NOTE — Patient Instructions (Signed)
Continue Aricept and Namenda  Try to decrease gabapentin 300 mg twice daily to see if less drowsiness Continue Verapamil for headache  Try to do more brain stimulating activities, exercise See you back in 6 months

## 2019-11-28 NOTE — Progress Notes (Signed)
PATIENT: Laurie Atkinson DOB: 1933/05/15  REASON FOR VISIT: follow up HISTORY FROM: patient  HISTORY OF PRESENT ILLNESS: Today 11/28/19  HISTORY Laurie Atkinson is a 86 year old female, seen in request by her primary care physician Dr. Lin Landsman, [name].  Evaluation of chronic headache, initial evaluation was on October 18, 2018.  She is accompanied by her daughter Laurie Atkinson at today's clinical visit.  I have reviewed and summarized the referring note from the referring physician.  She had past medical history of hypertension, hypothyroidism, on supplement, acid reflux disease, peptic ulcer disease with history of perforation,  She reported a history of migraine headaches, her typical migraine are moderate to severe holoacranial severe pounding headache with associated light noise sensitivity, she has stopped having frequent migraine for a while  But since beginning of 2020, she began to have frequent headaches again, bilateral temporal region, ringing in her ears, also complains of neck pain, she is having frequent headaches, mild degree on a daily basis, couple times each week, she has to take Tylenol for headache  Daughter also reported that patient has gradual onset memory loss, increased confusion during UTI, she also complains of few years history of bilateral lower extremity bilateral feet numbness tingling, slow worsening gait abnormality, she has neck pain, radiating pain to left shoulder, she denies significant low back pain.  I personally reviewed CT cervical without contrast February 2017: Multilevel cervical degenerative changes, there was no significant canal stenosis, variable degree of foraminal narrowing  Laboratory evaluation in 2020, CBC showed hemoglobin of 11.7, normal TSH, CMP, B12 of 558  UPDATE Nov 4th 2020: She return for electrodiagnostic study today, which showed no significant abnormality, there is no evidence of large fiber peripheral neuropathy or  bilateral lumbosacral radiculopathy  Laboratory evaluations showed normal ESR, C-reactive protein  She continue complains of frequent bilateral frontal headaches, similar to her migraine in the past, lasting couple hours, couple times each week, Aleve provide partial help  She also complains of bilateral feet paresthesia, tolerating gabapentin 300 mg every night,  Husband also reported she has gradual worsening mild memory loss, mildly unsteady gait, I personally reviewed MRI of the brain without contrast in October 2020: Mild supratentorium small vessel disease, mild generalized atrophy, no acute abnormality, single chronic microhemorrhage in the left frontal lobe.  UPDATE May 25 2019: She is accompanied by her husband at today's visit, continue complains of fatigue, daytime sleepiness, poor sleeping quality at nighttime, taking frequent naps during the day, also complains of worsening memory loss, frequent bilateral frontal temporal region headaches   we have personally reviewed MRI of the brain October 2020, generalized atrophy, mild to moderate supratentorium small vessel disease, chronic microhemorrhage at the left frontal lobe, no acute abnormality  Previous laboratory evaluation also showed normal ESR, C-reactive protein,  She has mild improvement with physical therapy   Update November 28, 2019 SS: Here with her husband,  Short-term memory loss: MMSE 20/30 today, on Aricept and Namenda, tolerating well, her husband manages her pills, has noted decline in memory, has trouble remembering names, this is embarrassing, has repetitive questioning, may occasionally misplace things.  Does her own ADLs, does the house work together with her husband, does the washing, cooking when her husband is home.  Sleeps well, but is drowsy during the day, takes naps.  On gabapentin 3 times daily for back and leg pain.  Overall, pleasant, mood is good.  Going to the beach in a few weeks. Just got a  coloring book.  Frequent headaches: No longer the bad migraine headaches, remains on verapamil, has been helpful, still has a few headaches a week, but not severe, never took tramadol, may take Tylenol or Aleve and lie down.  Pleased with headache control  Gait abnormality: No recent falls, on gabapentin, uses Rollator at home  REVIEW OF SYSTEMS: Out of a complete 14 system review of symptoms, the patient complains only of the following symptoms, and all other reviewed systems are negative.  Memory loss, headache  ALLERGIES: Allergies  Allergen Reactions  . Morphine And Related Hives  . Oxycodone Itching  . Percocet [Oxycodone-Acetaminophen] Itching  . Ambien [Zolpidem] Anxiety    HOME MEDICATIONS: Outpatient Medications Prior to Visit  Medication Sig Dispense Refill  . acetaminophen (TYLENOL) 500 MG tablet Take 1,000 mg by mouth every 6 (six) hours as needed for moderate pain or headache.     . Apoaequorin (PREVAGEN PO) Take 1 tablet by mouth daily.    Marland Kitchen aspirin 81 MG tablet Take 81 mg by mouth at bedtime.     . diphenhydrAMINE (BENADRYL) 25 mg capsule Take 25 mg by mouth daily as needed for allergies.     Marland Kitchen donepezil (ARICEPT) 10 MG tablet Take 1 tablet (10 mg total) by mouth at bedtime. 30 tablet 11  . gabapentin (NEURONTIN) 300 MG capsule Take 1 capsule (300 mg total) by mouth 3 (three) times daily. 90 capsule 11  . levothyroxine (SYNTHROID, LEVOTHROID) 50 MCG tablet TAKE 1 TABLET BY MOUTH ONCE DAILY 30 tablet 3  . memantine (NAMENDA) 10 MG tablet TAKE 1 TABLET BY MOUTH TWICE A DAY 180 tablet 4  . Multiple Vitamins-Minerals (CENTRUM SILVER 50+WOMEN PO) Take 1 tablet by mouth daily.    Marland Kitchen olmesartan (BENICAR) 20 MG tablet Take 20 mg by mouth daily.    Marland Kitchen omeprazole (PRILOSEC) 20 MG capsule Take 20 mg by mouth daily.    . Probiotic Product (PROBIOTIC DAILY PO) Take 1 capsule by mouth 2 (two) times daily.     . sodium chloride (OCEAN) 0.65 % SOLN nasal spray Place 2 sprays into both  nostrils at bedtime as needed for congestion.     . traMADol (ULTRAM) 50 MG tablet Take 1 tablet (50 mg total) by mouth every 6 (six) hours as needed. 30 tablet 5  . verapamil (CALAN-SR) 180 MG CR tablet Take 1 tablet (180 mg total) by mouth at bedtime. 30 tablet 11   No facility-administered medications prior to visit.    PAST MEDICAL HISTORY: Past Medical History:  Diagnosis Date  . Abnormality of gait   . Aneurysm of renal artery (HCC)   . Anxiety state, unspecified   . Arthritis    knees & neck   . Complication of anesthesia 1980's   decreased BP from medicine relative anesth. & her surgery got cancelled , but had surgery the next week, with no problems   . Coronary artery disease   . Depression   . Diplopia   . GERD (gastroesophageal reflux disease)    HIATAL HERNIA  . H/O echocardiogram 2014   [facility] for ? indigestion, told that it was wnl  . Headache    distant history of  . History of blood transfusion    with colon surgery   . Hypercalcemia   . Hypertension   . Hypothyroidism   . Memory loss   . Migraine   . Myocardial infarction ([place]) 1960's    ? when  . Neuropathy   . Other  specified disorder of peritoneum   . Renal artery aneurysm [facility]) March 2015  . Unspecified disorder of kidney and ureter     PAST SURGICAL HISTORY: Past Surgical History:  Procedure Laterality Date  . ABDOMINAL HYSTERECTOMY    . ANTERIOR CERVICAL DECOMP/DISCECTOMY FUSION N/A 04/05/2015   Procedure: Anterior Cervical Discectomy Decompression Fusion - Cervical three-Cervical four ;  Surgeon: Eustace Moore, MD;  Location: [facility];  Service: Neurosurgery;  Laterality: N/A;  . APPENDECTOMY    . BACK SURGERY    . CARDIAC CATHETERIZATION  1960's?   told- no blockages   . CATARACT EXTRACTION W/PHACO Left 06/16/2017   Procedure: CATARACT EXTRACTION PHACO AND INTRAOCULAR LENS PLACEMENT (IOC);  Surgeon: Birder Robson, MD;  Location: [facility];  Service: Ophthalmology;  Laterality:  Left;  [place] 00:46 AP% 14.1 CDE 6.56 Fluid pack lot # [id number]  . CATARACT EXTRACTION W/PHACO Right 07/27/2017   Procedure: CATARACT EXTRACTION PHACO AND INTRAOCULAR LENS PLACEMENT (IOC);  Surgeon: Birder Robson, MD;  Location: [facility] ORS;  Service: Ophthalmology;  Laterality: Right;  [place] 00:32.3 AP% 12.7 CDE 4.08 Fluid Pack Lot # [id number]  . CHOLECYSTECTOMY    . COLON SURGERY    . HAND SURGERY     thumb joint replacement   . HERNIA REPAIR  02/9145   Umbilical/ HIATAL  . TONSILLECTOMY      FAMILY HISTORY: Family History  Problem Relation Age of Onset  . Heart disease Mother        Heart Disease before age 44  . Stroke Mother        53  . Heart attack Mother   . Cancer Father        Throat  . Diabetes Father   . Heart attack Father   . Cancer Sister        Breast, Brain, Kidney  . Heart disease Brother        Pacemaker- Before age 47  . Heart attack Brother   . Diabetes Sister     SOCIAL HISTORY: Social History   Socioeconomic History  . Marital status: Married    Spouse name: Not on file  . Number of children: 3  . Years of education: 55  . Highest education level: High school graduate  Occupational History  . Occupation: Retired  Tobacco Use  . Smoking status: Never Smoker  . Smokeless tobacco: Never Used  Vaping Use  . Vaping Use: Never used  Substance and Sexual Activity  . Alcohol use: No  . Drug use: No  . Sexual activity: Not on file  Other Topics Concern  . Not on file  Social History Narrative   Lives at home with her husband.   Right-handed.   Two 16-oz bottles of soda per day.    Social Determinants of Health   Financial Resource Strain:   . Difficulty of Paying Living Expenses: Not on file  Food Insecurity:   . Worried About Charity fundraiser in the Last Year: Not on file  . Ran Out of Food in the Last Year: Not on file  Transportation Needs:   . Lack of Transportation (Medical): Not on file  . Lack of Transportation (Non-Medical):  Not on file  Physical Activity:   . Days of Exercise per Week: Not on file  . Minutes of Exercise per Session: Not on file  Stress:   . Feeling of Stress : Not on file  Social Connections:   . Frequency of Communication with Friends and Family:  Not on file  . Frequency of Social Gatherings with Friends and Family: Not on file  . Attends Religious Services: Not on file  . Active Member of Clubs or Organizations: Not on file  . Attends Archivist Meetings: Not on file  . Marital Status: Not on file  Intimate Partner Violence:   . Fear of Current or Ex-Partner: Not on file  . Emotionally Abused: Not on file  . Physically Abused: Not on file  . Sexually Abused: Not on file   PHYSICAL EXAM  Vitals:   11/28/19 1414  BP: 124/70  Pulse: 74  Weight: 195 lb 9.6 oz (88.7 kg)  Height: '5\' 7"'  (1.702 m)   Body mass index is 30.64 kg/m.  Generalized: Well developed, in no acute distress  MMSE - Mini Mental State Exam 11/28/2019 05/25/2019  Orientation to time 2 5  Orientation to Place 5 5  Registration 3 3  Attention/ Calculation 0 5  Recall 3 2  Language- name 2 objects 2 2  Language- repeat 1 1  Language- follow 3 step command 3 3  Language- read & follow direction 0 1  Write a sentence 1 1  Copy design 0 1  Total score 20 29    Neurological examination  Mentation: Alert oriented to time, place, history is equally provided by the patient and her husband. Follows all commands speech and language fluent Cranial nerve II-XII: Pupils were equal round reactive to light. Extraocular movements were full, visual field were full on confrontational test. Facial sensation and strength were normal. Head turning and shoulder shrug  were normal and symmetric. Motor: Good strength throughout Sensory: Sensory testing is intact to soft touch on all 4 extremities. No evidence of extinction is noted.  Coordination: Cerebellar testing reveals good finger-nose-finger and heel-to-shin  bilaterally.  Gait and station: Has to push off from seated position to stand, gait is slightly forward leaning, cautious, no assistive device Reflexes: Deep tendon reflexes are symmetric but decreased throughout  DIAGNOSTIC DATA (LABS, IMAGING, TESTING) - I reviewed patient records, labs, notes, testing and imaging myself where available.  Lab Results  Component Value Date   WBC 5.2 03/29/2015   HGB 11.9 (L) 03/29/2015   HCT 36.9 03/29/2015   MCV 85.4 03/29/2015   PLT 157 03/29/2015      Component Value Date/Time   NA 137 02/18/2017 1143   K 4.3 02/18/2017 1143   CL 103 02/18/2017 1143   CO2 27 02/18/2017 1143   GLUCOSE 78 02/18/2017 1143   BUN 17 02/18/2017 1143   CREATININE 0.93 02/18/2017 1143   CREATININE 1.47 (H) 10/13/2013 1546   CALCIUM 10.7 (H) 02/18/2017 1143   PROT 7.0 02/18/2017 1143   PROT 6.6 05/15/2013 1030   ALBUMIN 4.2 02/18/2017 1143   AST 19 02/18/2017 1143   ALT 13 02/18/2017 1143   ALKPHOS 105 02/18/2017 1143   BILITOT 0.3 02/18/2017 1143   GFRNONAA 43 (L) 03/29/2015 1258   GFRAA 50 (L) 03/29/2015 1258   No results found for: CHOL, HDL, LDLCALC, LDLDIRECT, TRIG, CHOLHDL No results found for: HGBA1C No results found for: VITAMINB12 Lab Results  Component Value Date   TSH 0.42 02/18/2017    ASSESSMENT AND PLAN 86 y.o. year old female  has a past medical history of Abnormality of gait, Aneurysm of renal artery ([place]), Anxiety state, unspecified, Arthritis, Complication of anesthesia (1980's), Coronary artery disease, Depression, Diplopia, GERD (gastroesophageal reflux disease), H/O echocardiogram (2014), Headache, History of blood transfusion, Hypercalcemia, Hypertension,  Hypothyroidism, Memory loss, Migraine, Myocardial infarction [facility]) (1960's ), Neuropathy, Other specified disorder of peritoneum, Renal artery aneurysm [facility]) (March 2015), and Unspecified disorder of kidney and ureter. here with:  1. Short-term memory loss -MMSE was 20/30,  decline -MRI of the brain showed generalized atrophy, supratentorial small vessel disease -Laboratory evaluation showed normal TSH, B12 -Continue Namenda 10 mg twice a day -Continue Aricept 10 mg daily -Recommend consistent exercise, brain stimulating exercises  2. Worsening frequent headaches -History of migraine -ESR, CRP were normal, no evidence of temporal arteritis -Continue verapamil SR 180 mg at bedtime for headache prevention -Never took tramadol for headache, can take Tylenol  3. Gait abnormality -Likely multifactorial, deconditioning, joint pain -EMG/NCV in November 2020 showed no evidence of large fiber peripheral neuropathy or bilateral lumbar sacral radiculopathy -Continue gabapentin 300 mg, may try twice a day instead of 3, to see if less daytime drowsiness -Follow-up in 6 months or sooner if needed  I spent 30 minutes of face-to-face and non-face-to-face time with patient.  This included previsit chart review, lab review, study review, order entry, electronic health record documentation, patient education.  Butler Denmark, AGNP-C, DNP 11/28/2019, 2:23 PM [facility] [address], [place], [place] [address] [phone]

## 2019-12-05 NOTE — Progress Notes (Signed)
I have reviewed and agreed above plan. 

## 2020-01-09 DIAGNOSIS — R5383 Other fatigue: Secondary | ICD-10-CM | POA: Diagnosis not present

## 2020-01-09 DIAGNOSIS — K529 Noninfective gastroenteritis and colitis, unspecified: Secondary | ICD-10-CM | POA: Diagnosis not present

## 2020-01-09 DIAGNOSIS — Z23 Encounter for immunization: Secondary | ICD-10-CM | POA: Diagnosis not present

## 2020-01-09 DIAGNOSIS — Z79899 Other long term (current) drug therapy: Secondary | ICD-10-CM | POA: Diagnosis not present

## 2020-01-09 DIAGNOSIS — E7800 Pure hypercholesterolemia, unspecified: Secondary | ICD-10-CM | POA: Diagnosis not present

## 2020-01-09 DIAGNOSIS — R5381 Other malaise: Secondary | ICD-10-CM | POA: Diagnosis not present

## 2020-01-12 DIAGNOSIS — D649 Anemia, unspecified: Secondary | ICD-10-CM | POA: Diagnosis not present

## 2020-01-30 DIAGNOSIS — L821 Other seborrheic keratosis: Secondary | ICD-10-CM | POA: Diagnosis not present

## 2020-01-30 DIAGNOSIS — L300 Nummular dermatitis: Secondary | ICD-10-CM | POA: Diagnosis not present

## 2020-03-26 DIAGNOSIS — I10 Essential (primary) hypertension: Secondary | ICD-10-CM | POA: Diagnosis not present

## 2020-03-26 DIAGNOSIS — R300 Dysuria: Secondary | ICD-10-CM | POA: Diagnosis not present

## 2020-03-26 DIAGNOSIS — Z9181 History of falling: Secondary | ICD-10-CM | POA: Diagnosis not present

## 2020-04-01 NOTE — Progress Notes (Signed)
PATIENT: Laurie Atkinson DOB: 1933-08-23  REASON FOR VISIT: follow up HISTORY FROM: patient  HISTORY OF PRESENT ILLNESS: Today 04/02/20  HISTORY Laurie Atkinson is a 87 year old female, seen in request by her primary care physician Dr. Lin Landsman, Laurie Atkinson.  Evaluation of chronic headache, initial evaluation was on October 18, 2018.  She is accompanied by her daughter Laurie Atkinson at today's clinical visit.  I have reviewed and summarized the referring note from the referring physician.  She had past medical history of hypertension, hypothyroidism, on supplement, acid reflux disease, peptic ulcer disease with history of perforation,  She reported a history of migraine headaches, her typical migraine are moderate to severe holoacranial severe pounding headache with associated light noise sensitivity, she has stopped having frequent migraine for a while  But since beginning of 2020, she began to have frequent headaches again, bilateral temporal region, ringing in her ears, also complains of neck pain, she is having frequent headaches, mild degree on a daily basis, couple times each week, she has to take Tylenol for headache  Daughter also reported that patient has gradual onset memory loss, increased confusion during UTI, she also complains of few years history of bilateral lower extremity bilateral feet numbness tingling, slow worsening gait abnormality, she has neck pain, radiating pain to left shoulder, she denies significant low back pain.  I personally reviewed CT cervical without contrast February 2017: Multilevel cervical degenerative changes, there was no significant canal stenosis, variable degree of foraminal narrowing  Laboratory evaluation in 2020, CBC showed hemoglobin of 11.7, normal TSH, CMP, B12 of 558  UPDATE Nov 4th 2020: She return for electrodiagnostic study today, which showed no significant abnormality, there is no evidence of large fiber peripheral neuropathy or  bilateral lumbosacral radiculopathy  Laboratory evaluations showed normal ESR, C-reactive protein  She continue complains of frequent bilateral frontal headaches, similar to her migraine in the past, lasting couple hours, couple times each week, Aleve provide partial help  She also complains of bilateral feet paresthesia, tolerating gabapentin 300 mg every night,  Husband also reported she has gradual worsening mild memory loss, mildly unsteady gait, I personally reviewed MRI of the brain without contrast in October 2020: Mild supratentorium small vessel disease, mild generalized atrophy, no acute abnormality, single chronic microhemorrhage in the left frontal lobe.  UPDATE May 25 2019: She is accompanied by her husband at today's visit, continue complains of fatigue, daytime sleepiness, poor sleeping quality at nighttime, taking frequent naps during the day, also complains of worsening memory loss, frequent bilateral frontal temporal region headaches   we have personally reviewed MRI of the brain October 2020, generalized atrophy, mild to moderate supratentorium small vessel disease, chronic microhemorrhage at the left frontal lobe, no acute abnormality  Previous laboratory evaluation also showed normal ESR, C-reactive protein,  She has mild improvement with physical therapy   Update November 28, 2019 SS: Here with her husband,  Short-term memory loss: MMSE 20/30 today, on Aricept and Namenda, tolerating well, her husband manages her pills, has noted decline in memory, has trouble remembering names, this is embarrassing, has repetitive questioning, may occasionally misplace things.  Does her own ADLs, does the house work together with her husband, does the washing, cooking when her husband is home.  Sleeps well, but is drowsy during the day, takes naps.  On gabapentin 3 times daily for back and leg pain.  Overall, pleasant, mood is good.  Going to the beach in a few weeks. Just got a  coloring book.  Frequent headaches: No longer the bad migraine headaches, remains on verapamil, has been helpful, still has a few headaches a week, but not severe, never took tramadol, may take Tylenol or Aleve and lie down.  Pleased with headache control  Gait abnormality: No recent falls, on gabapentin, uses Rollator at home  Update April 02, 2020 SS: MMSE 27/30 today. Here with husband and daughter, Laurie Atkinson.  Remains on verapamil, gabapentin, Aricept, Namenda.  Headaches: Still frequent headache; 3 types: back of neck, up to occipital area feels dizzy at times; frontal headache like ringing; then migraine feels nauseated, vomits, none recently. Takes Tylenol or Aleve. The neck/occipital area headaches associated with dizziness are new last several months, happens 1-2 times a week, this week after church had episode, was so dizzy, had to use her walker, had to lie down for several hours, there was no numbness or weakness. History of cervical spine surgery. Wears neck pillow.  Memory: short term memory is horrible, long term is good. Can't remember if she takes her medications, husband keeps up with. MMSE 27/30 today.  Is good historian today, in good spirits, pleasant, very cooperative.  REVIEW OF SYSTEMS: Out of a complete 14 system review of symptoms, the patient complains only of the following symptoms, and all other reviewed systems are negative.  Memory loss, headache  ALLERGIES: Allergies  Allergen Reactions  . Morphine And Related Hives  . Oxycodone Itching  . Percocet [Oxycodone-Acetaminophen] Itching  . Ambien [Zolpidem] Anxiety    HOME MEDICATIONS: Outpatient Medications Prior to Visit  Medication Sig Dispense Refill  . acetaminophen (TYLENOL) 500 MG tablet Take 1,000 mg by mouth every 6 (six) hours as needed for moderate pain or headache.     . alendronate (FOSAMAX) 70 MG tablet Take 70 mg by mouth once a week.    Marland Kitchen Apoaequorin (PREVAGEN PO) Take 1 tablet by mouth daily.     Marland Kitchen aspirin 81 MG tablet Take 81 mg by mouth at bedtime.     . diphenhydrAMINE (BENADRYL) 25 mg capsule Take 25 mg by mouth daily as needed for allergies.     Marland Kitchen donepezil (ARICEPT) 10 MG tablet Take 1 tablet (10 mg total) by mouth at bedtime. 30 tablet 11  . Fe Fum-FA-B Cmp-C-Zn-Mg-Mn-Cu (HEMOCYTE PLUS) 106-1 MG CAPS Take 1 capsule by mouth daily.    Marland Kitchen gabapentin (NEURONTIN) 300 MG capsule Take 1 capsule (300 mg total) by mouth 3 (three) times daily. 90 capsule 11  . levothyroxine (SYNTHROID, LEVOTHROID) 50 MCG tablet TAKE 1 TABLET BY MOUTH ONCE DAILY 30 tablet 3  . memantine (NAMENDA) 10 MG tablet Take 1 tablet (10 mg total) by mouth 2 (two) times daily. 180 tablet 4  . Multiple Vitamins-Minerals (CENTRUM SILVER 50+WOMEN PO) Take 1 tablet by mouth daily.    Marland Kitchen olmesartan (BENICAR) 20 MG tablet Take 20 mg by mouth daily.    Marland Kitchen omeprazole (PRILOSEC) 20 MG capsule Take 20 mg by mouth daily.    . Probiotic Product (PROBIOTIC DAILY PO) Take 1 capsule by mouth 2 (two) times daily.     . sodium chloride (OCEAN) 0.65 % SOLN nasal spray Place 2 sprays into both nostrils at bedtime as needed for congestion.     . verapamil (CALAN-SR) 180 MG CR tablet Take 1 tablet (180 mg total) by mouth at bedtime. 30 tablet 11  . traMADol (ULTRAM) 50 MG tablet Take 1 tablet (50 mg total) by mouth every 6 (six) hours as needed. 30 tablet 5  No facility-administered medications prior to visit.    PAST MEDICAL HISTORY: Past Medical History:  Diagnosis Date  . Abnormality of gait   . Aneurysm of renal artery (HCC)   . Anxiety state, unspecified   . Arthritis    knees & neck   . Complication of anesthesia 1980's   decreased BP from medicine relative anesth. & her surgery got cancelled , but had surgery the next week, with no problems   . Coronary artery disease   . Depression   . Diplopia   . GERD (gastroesophageal reflux disease)    HIATAL HERNIA  . H/O echocardiogram 2014   [facility] for ? indigestion,  told that it was wnl  . Headache    distant history of  . History of blood transfusion    with colon surgery   . Hypercalcemia   . Hypertension   . Hypothyroidism   . Memory loss   . Migraine   . Myocardial infarction ([place]) 1960's    ? when  . Neuropathy   . Other specified disorder of peritoneum   . Renal artery aneurysm [facility]) March 2015  . Unspecified disorder of kidney and ureter     PAST SURGICAL HISTORY: Past Surgical History:  Procedure Laterality Date  . ABDOMINAL HYSTERECTOMY    . ANTERIOR CERVICAL DECOMP/DISCECTOMY FUSION N/A 04/05/2015   Procedure: Anterior Cervical Discectomy Decompression Fusion - Cervical three-Cervical four ;  Surgeon: Eustace Moore, MD;  Location: [place] [facility];  Service: Neurosurgery;  Laterality: N/A;  . APPENDECTOMY    . BACK SURGERY    . CARDIAC CATHETERIZATION  1960's?   told- no blockages   . CATARACT EXTRACTION W/PHACO Left 06/16/2017   Procedure: CATARACT EXTRACTION PHACO AND INTRAOCULAR LENS PLACEMENT (IOC);  Surgeon: Birder Robson, MD;  Location: [facility] ORS;  Service: Ophthalmology;  Laterality: Left;  [place] 00:46 AP% 14.1 CDE 6.56 Fluid pack lot # [id number]  . CATARACT EXTRACTION W/PHACO Right 07/27/2017   Procedure: CATARACT EXTRACTION PHACO AND INTRAOCULAR LENS PLACEMENT (IOC);  Surgeon: Birder Robson, MD;  Location: [facility] ORS;  Service: Ophthalmology;  Laterality: Right;  [place] 00:32.3 AP% 12.7 CDE 4.08 Fluid Pack Lot # [id number]  . CHOLECYSTECTOMY    . COLON SURGERY    . HAND SURGERY     thumb joint replacement   . HERNIA REPAIR  02/9145   Umbilical/ HIATAL  . TONSILLECTOMY      FAMILY HISTORY: Family History  Problem Relation Age of Onset  . Heart disease Mother        Heart Disease before age 62  . Stroke Mother        72  . Heart attack Mother   . Cancer Father        Throat  . Diabetes Father   . Heart attack Father   . Cancer Sister        Breast, Brain, Kidney  . Heart disease Brother        Pacemaker- Before  age 14  . Heart attack Brother   . Diabetes Sister     SOCIAL HISTORY: Social History   Socioeconomic History  . Marital status: Married    Spouse name: Not on file  . Number of children: 3  . Years of education: 11  . Highest education level: High school graduate  Occupational History  . Occupation: Retired  Tobacco Use  . Smoking status: Never Smoker  . Smokeless tobacco: Never Used  Vaping Use  . Vaping Use: Never used  Substance and Sexual Activity  . Alcohol use: No  . Drug use: No  . Sexual activity: Not on file  Other Topics Concern  . Not on file  Social History Narrative   Lives at home with her husband.   Right-handed.   Two 16-oz bottles of soda per day.    Social Determinants of Health   Financial Resource Strain: Not on file  Food Insecurity: Not on file  Transportation Needs: Not on file  Physical Activity: Not on file  Stress: Not on file  Social Connections: Not on file  Intimate Partner Violence: Not on file   PHYSICAL EXAM  Vitals:   04/02/20 1327  BP: 129/63  Pulse: (!) 54  Weight: 188 lb (85.3 kg)  Height: '5\' 7"'  (1.702 m)   Body mass index is 29.44 kg/m.  Generalized: Well developed, in no acute distress  MMSE - Mini Mental State Exam 04/02/2020 11/28/2019 05/25/2019  Orientation to time '3 2 5  ' Orientation to Place '5 5 5  ' Registration '3 3 3  ' Attention/ Calculation 4 0 5  Recall '3 3 2  ' Language- name 2 objects '2 2 2  ' Language- repeat '1 1 1  ' Language- follow 3 step command '3 3 3  ' Language- read & follow direction 1 0 1  Write a sentence '1 1 1  ' Copy design 1 0 1  Total score '27 20 29    ' Neurological examination  Mentation: Alert oriented to time, place, history is equally provided by the patient and her husband. Follows all commands speech and language fluent Cranial nerve II-XII: Pupils were equal round reactive to light. Extraocular movements were full, visual field were full on confrontational test. Facial sensation and strength  were normal. Head turning and shoulder shrug  were normal and symmetric.With left cervical rotation reports pain Motor: Good strength throughout Sensory: Sensory testing is intact to soft touch on all 4 extremities. No evidence of extinction is noted.  Coordination: Cerebellar testing reveals good finger-nose-finger and heel-to-shin bilaterally.  Gait and station: Has to push off from seated position to stand, gait is slightly forward leaning, cautious, no assistive device Reflexes: Deep tendon reflexes are symmetric but decreased throughout  DIAGNOSTIC DATA (LABS, IMAGING, TESTING) - I reviewed patient records, labs, notes, testing and imaging myself where available.  Lab Results  Component Value Date   WBC 5.2 03/29/2015   HGB 11.9 (L) 03/29/2015   HCT 36.9 03/29/2015   MCV 85.4 03/29/2015   PLT 157 03/29/2015      Component Value Date/Time   NA 137 02/18/2017 1143   K 4.3 02/18/2017 1143   CL 103 02/18/2017 1143   CO2 27 02/18/2017 1143   GLUCOSE 78 02/18/2017 1143   BUN 17 02/18/2017 1143   CREATININE 0.93 02/18/2017 1143   CREATININE 1.47 (H) 10/13/2013 1546   CALCIUM 10.7 (H) 02/18/2017 1143   PROT 7.0 02/18/2017 1143   PROT 6.6 05/15/2013 1030   ALBUMIN 4.2 02/18/2017 1143   AST 19 02/18/2017 1143   ALT 13 02/18/2017 1143   ALKPHOS 105 02/18/2017 1143   BILITOT 0.3 02/18/2017 1143   GFRNONAA 43 (L) 03/29/2015 1258   GFRAA 50 (L) 03/29/2015 1258   No results found for: CHOL, HDL, LDLCALC, LDLDIRECT, TRIG, CHOLHDL No results found for: HGBA1C No results found for: VITAMINB12 Lab Results  Component Value Date   TSH 0.42 02/18/2017    ASSESSMENT AND PLAN 87 y.o. year old female  has a past medical history of Abnormality  of gait, Aneurysm of renal artery ([place]), Anxiety state, unspecified, Arthritis, Complication of anesthesia (1980's), Coronary artery disease, Depression, Diplopia, GERD (gastroesophageal reflux disease), H/O echocardiogram (2014), Headache, History  of blood transfusion, Hypercalcemia, Hypertension, Hypothyroidism, Memory loss, Migraine, Myocardial infarction ([place]) (1960's ), Neuropathy, Other specified disorder of peritoneum, Renal artery aneurysm [facility]) (March 2015), and Unspecified disorder of kidney and ureter. here with:  1. Short-term memory loss -MMSE was 27/30 -MRI of the brain 2020 showed generalized atrophy, supratentorial small vessel disease -Laboratory evaluation showed normal TSH, B12 -Continue Namenda 10 mg twice a day -Continue Aricept 10 mg daily  2. Worsening frequent headaches -History of migraine, but new headache last several months, starts in neck, localizes bilateral occipital area, associated with dizziness, requiring her to use a walker -Given new type of headache, age, associated gait abnormalities, will check MRI of the brain and cervical spine  (cervical spine was intended to be ordered in 2020 for possible cervical spondylitic disease) -ESR, CRP were normal, no evidence of temporal arteritis -Continue verapamil SR 180 mg at bedtime for headache prevention, didn't increase this due to HR 54 -Will send tramadol 50 mg as needed for acute headache, has done before, not to be taken more than 2 days a week -Add on nortriptyline 10 mg at bedtime for headache prevention  3. Gait abnormality -Likely multifactorial, deconditioning, joint pain -EMG/NCV in November 2020 showed no evidence of large fiber peripheral neuropathy or bilateral lumbar sacral radiculopathy -Continue gabapentin 300 mg 3 times daily -Also, checking MRI cervical spine (see above) -Follow-up in 4 months or sooner if needed  I spent 30 minutes of face-to-face and non-face-to-face time with patient.  This included previsit chart review, lab review, study review, order entry, electronic health record documentation, patient education.  Butler Denmark, AGNP-C, DNP 04/02/2020, 1:45 PM [facility] [address] [place], [place]  [id number] [phone]

## 2020-04-01 NOTE — Telephone Encounter (Addendum)
I spoke to her daughter, Lynden Ang. She is reporting worsening short-term memory loss - often repeats herself. She is also complaining about episodes of headaches and dizziness. Her daughter states that when she has these events, her eyes lack clarity and her verbal responses are sluggish. Additionally, she was recently prescribed Xanax and a sleeping medication from her PCP (she is unsure of the name but will bring it to her appt). She has instructed her father to not give her either of these medications. Her daughter feels she needs to be evaluated sooner than April. Her appt has been moved to 04/02/20 at 1:45pm. She will check-in at 1:15pm.

## 2020-04-02 ENCOUNTER — Encounter: Payer: Self-pay | Admitting: Neurology

## 2020-04-02 ENCOUNTER — Ambulatory Visit (INDEPENDENT_AMBULATORY_CARE_PROVIDER_SITE_OTHER): Payer: Medicare Other | Admitting: Neurology

## 2020-04-02 VITALS — BP 129/63 | HR 54 | Ht 67.0 in | Wt 188.0 lb

## 2020-04-02 DIAGNOSIS — R413 Other amnesia: Secondary | ICD-10-CM | POA: Diagnosis not present

## 2020-04-02 DIAGNOSIS — R519 Headache, unspecified: Secondary | ICD-10-CM

## 2020-04-02 DIAGNOSIS — R269 Unspecified abnormalities of gait and mobility: Secondary | ICD-10-CM | POA: Diagnosis not present

## 2020-04-02 MED ORDER — TRAMADOL HCL 50 MG PO TABS: 50.0000 mg | ORAL_TABLET | Freq: Four times a day (QID) | ORAL | 0 refills | Status: AC | PRN

## 2020-04-02 MED ORDER — VERAPAMIL HCL ER 180 MG PO TBCR: 180.0000 mg | EXTENDED_RELEASE_TABLET | Freq: Every day | ORAL | 11 refills | Status: DC

## 2020-04-02 MED ORDER — NORTRIPTYLINE HCL 10 MG PO CAPS: 10.0000 mg | ORAL_CAPSULE | Freq: Every day | ORAL | 3 refills | Status: DC

## 2020-04-02 NOTE — Addendum Note (Signed)
Addended by: Levert Feinstein on: 04/02/2020 03:17 PM   Modules accepted: Orders

## 2020-04-02 NOTE — Progress Notes (Signed)
Addendum: Chart reviewed, patient denies significant focal signs, MRI in October 2020 showed no acute abnormality, atrophy, supratentorium small vessel disease, will not repeat MRI of the brain  Agree with MRI cervical spine

## 2020-04-02 NOTE — Patient Instructions (Signed)
Add on nortriptyline 10 mg at bedtime for headache prevention  Continue the verapamil for headache prevention  Check MRI brain and cervical spine due to new headache  Take tramadol for acute headache only, no more 2 days a week  See you back in 4 months

## 2020-04-03 ENCOUNTER — Telehealth: Payer: Self-pay | Admitting: Neurology

## 2020-04-03 NOTE — Telephone Encounter (Signed)
medicare/bcbs [name]: [id number] (exp. 04/03/20 to 09/29/20) order sent to GI. They will reach out to the patient to schedule.

## 2020-04-04 DIAGNOSIS — L821 Other seborrheic keratosis: Secondary | ICD-10-CM | POA: Diagnosis not present

## 2020-04-04 DIAGNOSIS — L300 Nummular dermatitis: Secondary | ICD-10-CM | POA: Diagnosis not present

## 2020-04-04 DIAGNOSIS — L578 Other skin changes due to chronic exposure to nonionizing radiation: Secondary | ICD-10-CM | POA: Diagnosis not present

## 2020-04-16 NOTE — Telephone Encounter (Signed)
Patient daughter Lynden Ang left me a voicemail on my phone about her mom's MRI. I called Vicky back she did not pick up I left her a voicemail informing her that GI has tried to reach out to her twice on 04/04/20 & 04/08/20. I left their number of [phone] to give them a call and they can schedule the MRI.

## 2020-04-24 ENCOUNTER — Other Ambulatory Visit: Payer: Self-pay | Admitting: Neurology

## 2020-04-25 ENCOUNTER — Encounter: Payer: Self-pay | Admitting: Neurology

## 2020-04-26 ENCOUNTER — Other Ambulatory Visit: Payer: Self-pay

## 2020-04-26 ENCOUNTER — Ambulatory Visit
Admission: RE | Admit: 2020-04-26 | Discharge: 2020-04-26 | Disposition: A | Discharge status: Home / Self Care | Payer: Medicare Other | Source: Ambulatory Visit | Attending: Neurology | Admitting: Neurology

## 2020-04-26 DIAGNOSIS — R519 Headache, unspecified: Secondary | ICD-10-CM

## 2020-04-29 DIAGNOSIS — Z23 Encounter for immunization: Secondary | ICD-10-CM | POA: Diagnosis not present

## 2020-04-29 DIAGNOSIS — I739 Peripheral vascular disease, unspecified: Secondary | ICD-10-CM | POA: Diagnosis not present

## 2020-04-29 DIAGNOSIS — G3184 Mild cognitive impairment, so stated: Secondary | ICD-10-CM | POA: Diagnosis not present

## 2020-04-29 DIAGNOSIS — I722 Aneurysm of renal artery: Secondary | ICD-10-CM | POA: Diagnosis not present

## 2020-04-29 DIAGNOSIS — N289 Disorder of kidney and ureter, unspecified: Secondary | ICD-10-CM | POA: Diagnosis not present

## 2020-04-29 DIAGNOSIS — D509 Iron deficiency anemia, unspecified: Secondary | ICD-10-CM | POA: Diagnosis not present

## 2020-04-29 DIAGNOSIS — N1832 Chronic kidney disease, stage 3b: Secondary | ICD-10-CM | POA: Diagnosis not present

## 2020-04-29 DIAGNOSIS — E871 Hypo-osmolality and hyponatremia: Secondary | ICD-10-CM | POA: Diagnosis not present

## 2020-04-29 DIAGNOSIS — K769 Liver disease, unspecified: Secondary | ICD-10-CM | POA: Diagnosis not present

## 2020-04-29 DIAGNOSIS — R2681 Unsteadiness on feet: Secondary | ICD-10-CM | POA: Diagnosis not present

## 2020-04-29 DIAGNOSIS — E038 Other specified hypothyroidism: Secondary | ICD-10-CM | POA: Diagnosis not present

## 2020-04-30 ENCOUNTER — Telehealth: Payer: Self-pay | Admitting: Neurology

## 2020-04-30 NOTE — Telephone Encounter (Signed)
MRI of cervical spine shows no significant abnormalities, there is overall some degenerative changes, along with some narrowing and spinal stenosis, but no nerve root compression.  These findings could result in cervicogenic headache, which she describes at last visit.  Was started on nortriptyline, this should be helpful.  IMPRESSION: This MRI of the cervical spine without contrast shows the following: 1.   The spinal cord appears normal. 2.   C3-C4 ACDF.  There is no nerve root compression or spinal stenosis at this level. 3.   At C4-C5, there are degenerative changes causing mild left foraminal narrowing but no nerve root compression or spinal stenosis.   4.   At C5-C6, there is mild spinal stenosis and moderate bilateral foraminal narrowing but no nerve root compression. 5.   At C6-C7, there is borderline spinal stenosis but no nerve root compression. 6.   At C7-T1, there is 2 mm anterolisthesis but no spinal stenosis or nerve root compression.

## 2020-05-01 NOTE — Telephone Encounter (Signed)
Called and LMVM for mobile, to call back when available.

## 2020-05-02 NOTE — Telephone Encounter (Signed)
I called pt, spoke to daughter. I relayed the results of cervical MRI, brain not needed as had one back in 2020 per SS/NP.  No significant abnormalities noted.  No nerve root compression. Noted some benefit from nortriptyline, but she feels more issues of sitting with neck pillow and not exercising neck as been instructed previously by ortho when had neck surgery.  Daughter Lynden Ang, states that have made appt for massages and will let us know if things change.

## 2020-05-07 ENCOUNTER — Telehealth: Payer: Self-pay | Admitting: Emergency Medicine

## 2020-05-28 ENCOUNTER — Ambulatory Visit: Payer: Medicare Other | Admitting: Neurology

## 2020-06-26 DIAGNOSIS — Z0000 Encounter for general adult medical examination without abnormal findings: Secondary | ICD-10-CM | POA: Diagnosis not present

## 2020-08-07 ENCOUNTER — Encounter: Payer: Self-pay | Admitting: Neurology

## 2020-08-07 ENCOUNTER — Ambulatory Visit (INDEPENDENT_AMBULATORY_CARE_PROVIDER_SITE_OTHER): Payer: Medicare Other | Admitting: Neurology

## 2020-08-07 VITALS — BP 110/77 | HR 71 | Ht 67.0 in | Wt 181.0 lb

## 2020-08-07 DIAGNOSIS — R413 Other amnesia: Secondary | ICD-10-CM

## 2020-08-07 DIAGNOSIS — R519 Headache, unspecified: Secondary | ICD-10-CM | POA: Diagnosis not present

## 2020-08-07 DIAGNOSIS — R269 Unspecified abnormalities of gait and mobility: Secondary | ICD-10-CM | POA: Diagnosis not present

## 2020-08-07 MED ORDER — GABAPENTIN 300 MG PO CAPS: 300.0000 mg | ORAL_CAPSULE | Freq: Three times a day (TID) | ORAL | 11 refills | Status: DC

## 2020-08-07 MED ORDER — UBRELVY 50 MG PO TABS: 50.0000 mg | ORAL_TABLET | ORAL | 5 refills | Status: DC | PRN

## 2020-08-07 MED ORDER — MEMANTINE HCL 10 MG PO TABS: 10.0000 mg | ORAL_TABLET | Freq: Two times a day (BID) | ORAL | 4 refills | Status: DC

## 2020-08-07 MED ORDER — DONEPEZIL HCL 10 MG PO TABS: 10.0000 mg | ORAL_TABLET | Freq: Every day | ORAL | 11 refills | Status: DC

## 2020-08-07 MED ORDER — NORTRIPTYLINE HCL 10 MG PO CAPS: 10.0000 mg | ORAL_CAPSULE | Freq: Every day | ORAL | 3 refills | Status: DC

## 2020-08-07 NOTE — Progress Notes (Signed)
PATIENT: Laurie Atkinson DOB: [birth year]  REASON FOR VISIT: follow up HISTORY FROM: patient Primary Neurologist: Dr. Krista Atkinson   HISTORY Laurie Atkinson is a 87 year old female, seen in request by her primary care physician Dr. Lin Atkinson, Laurie Atkinson.  Evaluation of chronic headache, initial evaluation was on October 18, 2018.  She is accompanied by her daughter Laurie Atkinson at today's clinical visit.   I have reviewed and summarized the referring note from the referring physician.  She had past medical history of hypertension, hypothyroidism, on supplement, acid reflux disease, peptic ulcer disease with history of perforation,   She reported a history of migraine headaches, her typical migraine are moderate to severe holoacranial severe pounding headache with associated light noise sensitivity, she has stopped having frequent migraine for a while   But since beginning of 2020, she began to have frequent headaches again, bilateral temporal region, ringing in her ears, also complains of neck pain, she is having frequent headaches, mild degree on a daily basis, couple times each week, she has to take Tylenol for headache   Daughter also reported that patient has gradual onset memory loss, increased confusion during UTI, she also complains of few years history of bilateral lower extremity bilateral feet numbness tingling, slow worsening gait abnormality, she has neck pain, radiating pain to left shoulder, she denies significant low back pain.   I personally reviewed CT cervical without contrast February 2017: Multilevel cervical degenerative changes, there was no significant canal stenosis, variable degree of foraminal narrowing   Laboratory evaluation in 2020, CBC showed hemoglobin of 11.7, normal TSH, CMP, B12 of 558   UPDATE Nov 4th 2020: She return for electrodiagnostic study today, which showed no significant abnormality, there is no evidence of large fiber peripheral neuropathy or bilateral lumbosacral  radiculopathy   Laboratory evaluations showed normal ESR, C-reactive protein   She continue complains of frequent bilateral frontal headaches, similar to her migraine in the past, lasting couple hours, couple times each week, Aleve provide partial help   She also complains of bilateral feet paresthesia, tolerating gabapentin 300 mg every night,   Husband also reported she has gradual worsening mild memory loss, mildly unsteady gait, I personally reviewed MRI of the brain without contrast in October 2020: Mild supratentorium small vessel disease, mild generalized atrophy, no acute abnormality, single chronic microhemorrhage in the left frontal lobe.   UPDATE May 25 2019: She is accompanied by her husband at today's visit, continue complains of fatigue, daytime sleepiness, poor sleeping quality at nighttime, taking frequent naps during the day, also complains of worsening memory loss, frequent bilateral frontal temporal region headaches    we have personally reviewed MRI of the brain October 2020, generalized atrophy, mild to moderate supratentorium small vessel disease, chronic microhemorrhage at the left frontal lobe, no acute abnormality   Previous laboratory evaluation also showed normal ESR, C-reactive protein,   She has mild improvement with physical therapy   Update November 28, 2019 SS: Here with her husband,  Short-term memory loss: MMSE 20/30 today, on Aricept and Namenda, tolerating well, her husband manages her pills, has noted decline in memory, has trouble remembering names, this is embarrassing, has repetitive questioning, may occasionally misplace things.  Does her own ADLs, does the house work together with her husband, does the washing, cooking when her husband is home.  Sleeps well, but is drowsy during the day, takes naps.  On gabapentin 3 times daily for back and leg pain.  Overall, pleasant, mood is good.  Going to the beach in a few weeks. Just got a coloring  book.  Frequent headaches: No longer the bad migraine headaches, remains on verapamil, has been helpful, still has a few headaches a week, but not severe, never took tramadol, may take Tylenol or Aleve and lie down.  Pleased with headache control  Gait abnormality: No recent falls, on gabapentin, uses Rollator at home  Update April 02, 2020 SS: MMSE 27/30 today. Here with husband and daughter, Laurie Atkinson.  Remains on verapamil, gabapentin, Aricept, Namenda.  Headaches: Still frequent headache; 3 types: back of neck, up to occipital area feels dizzy at times; frontal headache like ringing; then migraine feels nauseated, vomits, none recently. Takes Tylenol or Aleve. The neck/occipital area headaches associated with dizziness are new last several months, happens 1-2 times a week, this week after church had episode, was so dizzy, had to use her walker, had to lie down for several hours, there was no numbness or weakness. History of cervical spine surgery. Wears neck pillow. .   Memory: short term memory is horrible, long term is good. Can't remember if she takes her medications, husband keeps up with. MMSE 27/30 today.  Is good historian today, in good spirits, pleasant, very cooperative.  Update August 07, 2020 SS: Here today with husband. Just celebrated 12 th anniversary. Still headaches, right sided, retro-orbital, this is migraine. Neck, occipital headache is better with nortriptyline, can move neck easier. Migraines are 1-2 a month. Takes tylenol, doesn't help, could last all day. Changed PCP, seeing Dr. Selena Atkinson, with [facility] Tramadol helps headache, but gives her too much energy. No falls, uses walker.   MRI cervical spine showed no significant abnormalities in March 2022, overall some degenerative changes along with narrowing and spinal stenosis, but no nerve root compression.  REVIEW OF SYSTEMS: Out of a complete 14 system review of symptoms, the patient complains only of the following symptoms, and all  other reviewed systems are negative.  Memory loss, headache  ALLERGIES: Allergies  Allergen Reactions   Morphine And Related Hives   Oxycodone Itching   Percocet [Oxycodone-Acetaminophen] Itching   Ambien [Zolpidem] Anxiety    HOME MEDICATIONS: Outpatient Medications Prior to Visit  Medication Sig Dispense Refill   acetaminophen (TYLENOL) 500 MG tablet Take 1,000 mg by mouth every 6 (six) hours as needed for moderate pain or headache.      alendronate (FOSAMAX) 70 MG tablet Take 70 mg by mouth once a week.     Apoaequorin (PREVAGEN PO) Take 1 tablet by mouth daily.     aspirin 81 MG tablet Take 81 mg by mouth at bedtime.      diphenhydrAMINE (BENADRYL) 25 mg capsule Take 25 mg by mouth daily as needed for allergies.      Fe Fum-FA-B Cmp-C-Zn-Mg-Mn-Cu (HEMOCYTE PLUS) 106-1 MG CAPS Take 1 capsule by mouth daily.     levothyroxine (SYNTHROID, LEVOTHROID) 50 MCG tablet TAKE 1 TABLET BY MOUTH ONCE DAILY 30 tablet 3   Multiple Vitamins-Minerals (CENTRUM SILVER 50+WOMEN PO) Take 1 tablet by mouth daily.     olmesartan (BENICAR) 20 MG tablet Take 20 mg by mouth daily.     omeprazole (PRILOSEC) 20 MG capsule Take 20 mg by mouth daily.     Probiotic Product (PROBIOTIC DAILY PO) Take 1 capsule by mouth 2 (two) times daily.      sodium chloride (OCEAN) 0.65 % SOLN nasal spray Place 2 sprays into both nostrils at bedtime as needed for congestion.  traMADol (ULTRAM) 50 MG tablet Take 1 tablet (50 mg total) by mouth every 6 (six) hours as needed (for acute migraine). 30 tablet 0   verapamil (CALAN-SR) 180 MG CR tablet Take 1 tablet (180 mg total) by mouth at bedtime. 30 tablet 11   donepezil (ARICEPT) 10 MG tablet Take 1 tablet (10 mg total) by mouth at bedtime. 30 tablet 11   gabapentin (NEURONTIN) 300 MG capsule Take 1 capsule (300 mg total) by mouth 3 (three) times daily. (Patient taking differently: Take 300 mg by mouth 2 (two) times daily.) 90 capsule 11   memantine (NAMENDA) 10 MG tablet  Take 1 tablet (10 mg total) by mouth 2 (two) times daily. 180 tablet 4   nortriptyline (PAMELOR) 10 MG capsule TAKE 1 CAPSULE BY MOUTH AT BEDTIME. 90 capsule 2   No facility-administered medications prior to visit.    PAST MEDICAL HISTORY: Past Medical History:  Diagnosis Date   Abnormality of gait    Aneurysm of renal artery (HCC)    Anxiety state, unspecified    Arthritis    knees & neck    Complication of anesthesia 1980's   decreased BP from medicine relative anesth. & her surgery got cancelled , but had surgery the next week, with no problems    Coronary artery disease    Depression    Diplopia    GERD (gastroesophageal reflux disease)    HIATAL HERNIA   H/O echocardiogram 2014   [place]. for ? indigestion, told that it was wnl   Headache    distant history of   History of blood transfusion    with colon surgery    Hypercalcemia    Hypertension    Hypothyroidism    Memory loss    Migraine    Myocardial infarction [facility]) 1960's    ? when   Neuropathy    Other specified disorder of peritoneum    Renal artery aneurysm [facility]) March 2015   Unspecified disorder of kidney and ureter     PAST SURGICAL HISTORY: Past Surgical History:  Procedure Laterality Date   ABDOMINAL HYSTERECTOMY     ANTERIOR CERVICAL DECOMP/DISCECTOMY FUSION N/A 04/05/2015   Procedure: Anterior Cervical Discectomy Decompression Fusion - Cervical three-Cervical four ;  Surgeon: Eustace Moore, MD;  Location: [facility];  Service: Neurosurgery;  Laterality: N/A;   APPENDECTOMY     BACK SURGERY     CARDIAC CATHETERIZATION  1960's?   told- no blockages    CATARACT EXTRACTION W/PHACO Left 06/16/2017   Procedure: CATARACT EXTRACTION PHACO AND INTRAOCULAR LENS PLACEMENT (IOC);  Surgeon: Birder Robson, MD;  Location: [facility] ORS;  Service: Ophthalmology;  Laterality: Left;  [place] 00:46 AP% 14.1 CDE 6.56 Fluid pack lot # [id number]   CATARACT EXTRACTION W/PHACO Right 07/27/2017   Procedure: CATARACT  EXTRACTION PHACO AND INTRAOCULAR LENS PLACEMENT (IOC);  Surgeon: Birder Robson, MD;  Location: [facility] ORS;  Service: Ophthalmology;  Laterality: Right;  [place] 00:32.3 AP% 12.7 CDE 4.08 Fluid Pack Lot # [id number]   CHOLECYSTECTOMY     COLON SURGERY     HAND SURGERY     thumb joint replacement    HERNIA REPAIR  02/9145   Umbilical/ HIATAL   TONSILLECTOMY      FAMILY HISTORY: Family History  Problem Relation Age of Onset   Heart disease Mother        Heart Disease before age 59   Stroke Mother        1965   Heart attack  Mother    Cancer Father        Throat   Diabetes Father    Heart attack Father    Cancer Sister        Breast, Brain, Kidney   Heart disease Brother        Pacemaker- Before age 66   Heart attack Brother    Diabetes Sister     SOCIAL HISTORY: Social History   Socioeconomic History   Marital status: Married    Spouse name: Not on file   Number of children: 3   Years of education: 12   Highest education level: High school graduate  Occupational History   Occupation: Retired  Tobacco Use   Smoking status: Never   Smokeless tobacco: Never  Scientific laboratory technician Use: Never used  Substance and Sexual Activity   Alcohol use: No   Drug use: No   Sexual activity: Not on file  Other Topics Concern   Not on file  Social History Narrative   Lives at home with her husband.   Right-handed.   Two 16-oz bottles of soda per day.    Social Determinants of Health   Financial Resource Strain: Not on file  Food Insecurity: Not on file  Transportation Needs: Not on file  Physical Activity: Not on file  Stress: Not on file  Social Connections: Not on file  Intimate Partner Violence: Not on file   PHYSICAL EXAM  Vitals:   08/07/20 1417  BP: 110/77  Pulse: 71  Weight: 181 lb (82.1 kg)  Height: '5\' 7"'  (1.702 m)    Body mass index is 28.35 kg/m.  Generalized: Well developed, in no acute distress  MMSE - Mini Mental State Exam 04/02/2020 11/28/2019  05/25/2019  Orientation to time '3 2 5  ' Orientation to Place '5 5 5  ' Registration '3 3 3  ' Attention/ Calculation 4 0 5  Recall '3 3 2  ' Language- name 2 objects '2 2 2  ' Language- repeat '1 1 1  ' Language- follow 3 step command '3 3 3  ' Language- read & follow direction 1 0 1  Write a sentence '1 1 1  ' Copy design 1 0 1  Total score '27 20 29    ' Neurological examination  Mentation: Alert oriented to time, place, history is equally provided by the patient and her husband. Follows all commands speech and language fluent Cranial nerve II-XII: Pupils were equal round reactive to light. Extraocular movements were full, visual field were full on confrontational test. Facial sensation and strength were normal. Head turning and shoulder shrug  were normal and symmetric. Motor: Good strength throughout Sensory: Sensory testing is intact to soft touch on all 4 extremities. No evidence of extinction is noted.  Coordination: Cerebellar testing reveals good finger-nose-finger and heel-to-shin bilaterally.  Gait and station: Has to push off from seated position to stand, gait is slightly forward leaning, cautious, no assistive device Reflexes: Deep tendon reflexes are symmetric but decreased throughout  DIAGNOSTIC DATA (LABS, IMAGING, TESTING) - I reviewed patient records, labs, notes, testing and imaging myself where available.  Lab Results  Component Value Date   WBC 5.2 03/29/2015   HGB 11.9 (L) 03/29/2015   HCT 36.9 03/29/2015   MCV 85.4 03/29/2015   PLT 157 03/29/2015      Component Value Date/Time   NA 137 02/18/2017 1143   K 4.3 02/18/2017 1143   CL 103 02/18/2017 1143   CO2 27 02/18/2017 1143   GLUCOSE 78 02/18/2017  1143   BUN 17 02/18/2017 1143   CREATININE 0.93 02/18/2017 1143   CREATININE 1.47 (H) 10/13/2013 1546   CALCIUM 10.7 (H) 02/18/2017 1143   PROT 7.0 02/18/2017 1143   PROT 6.6 05/15/2013 1030   ALBUMIN 4.2 02/18/2017 1143   AST 19 02/18/2017 1143   ALT 13 02/18/2017 1143    ALKPHOS 105 02/18/2017 1143   BILITOT 0.3 02/18/2017 1143   GFRNONAA 43 (L) 03/29/2015 1258   GFRAA 50 (L) 03/29/2015 1258   No results found for: CHOL, HDL, LDLCALC, LDLDIRECT, TRIG, CHOLHDL No results found for: HGBA1C No results found for: VITAMINB12 Lab Results  Component Value Date   TSH 0.42 02/18/2017    ASSESSMENT AND PLAN 87 y.o. year old female  has a past medical history of Abnormality of gait, Aneurysm of renal artery (HCC), Anxiety state, unspecified, Arthritis, Complication of anesthesia (1980's), Coronary artery disease, Depression, Diplopia, GERD (gastroesophageal reflux disease), H/O echocardiogram (2014), Headache, History of blood transfusion, Hypercalcemia, Hypertension, Hypothyroidism, Memory loss, Migraine, Myocardial infarction ([place]) (1960's ), Neuropathy, Other specified disorder of peritoneum, Renal artery aneurysm [facility]) (March 2015), and Unspecified disorder of kidney and ureter. here with:  1. Short-term memory loss -MMSE was 27/30 in March 2022, things seem stable -MRI of the brain 2020 showed generalized atrophy, supratentorial small vessel disease -Laboratory evaluation showed normal TSH, B12 -Continue Namenda 10 mg twice a day -Continue Aricept 10 mg daily  2. Worsening frequent headaches -Doing better, on average 1-2 a month, long history of migraine headaches -Try Ubrelvy 50 mg as needed for acute headache, tramadol helps the headache, but has adverse effect of increased energy, is not a candidate for triptan given her age, vascular risk factors -Continue nortriptyline 10 mg at bedtime, verapamil 180 mg at bedtime for migraine prevention -ESR, CRP have been normal, no evidence of temporal arteritis -MRI of cervical spine showed mild degenerative changes, along with some narrowing and spinal stenosis, but no nerve root compression, post cervical spine C3-4 ACDF; cervicogenic headache is better with nortriptyline  3. Gait abnormality -Likely  multifactorial, deconditioning, joint pain -EMG/NCV in November 2020 showed no evidence of large fiber peripheral neuropathy or bilateral lumbar sacral radiculopathy -Continue gabapentin 300 mg 3 times daily -Follow-up in 6 months or sooner if needed  I spent 32 minutes of face-to-face and non-face-to-face time with patient.  This included previsit chart review, lab review, study review, order entry, discussing medications, management, and follow-up.  Butler Denmark, AGNP-C, DNP 08/07/2020, 2:44 PM [facility] [address] [place], [place] [address] [phone]

## 2020-08-07 NOTE — Patient Instructions (Addendum)
Add on Ubrelvy as needed for severe migraine, take 1 at onset of migraine, may repeat in 2 hours  Continue current medications  See you back in 6 months

## 2020-08-08 ENCOUNTER — Telehealth: Payer: Self-pay

## 2020-08-08 NOTE — Telephone Encounter (Signed)
I have submitted PA request for Ubrelvy 50mg  on CMM, Key: . Awaiting determination.

## 2020-08-08 NOTE — Telephone Encounter (Signed)
PA approved by [facility]. Valid through 08/08/2021. Call [phone] for any issues.

## 2020-08-24 IMAGING — MR MR ABDOMEN WO/W CM
10 of 18 series · 22 of 48 positions shown · IV contrast (8 GAD)
Comparison: 01/17/2019

CLINICAL DATA: Liver lesion, follow-up evaluation

EXAM:
MRI ABDOMEN WITHOUT AND WITH CONTRAST
TECHNIQUE: Multiplanar multisequence MR imaging of the abdomen was performed
both before and after the administration of intravenous contrast.
CONTRAST:  8mL GADAVIST GADOBUTROL 1 MMOL/ML IV SOLN

[Series 4: cor ssfse nav · coronal · 6.0mm · 0.78mm/px · 2 of 40 slices shown]
[im 1/40]
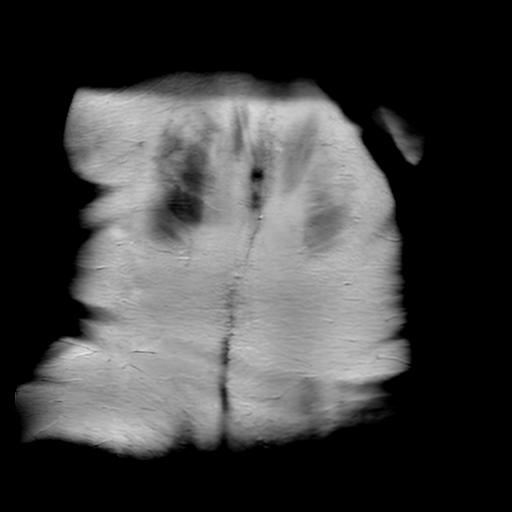
[im 40/40]
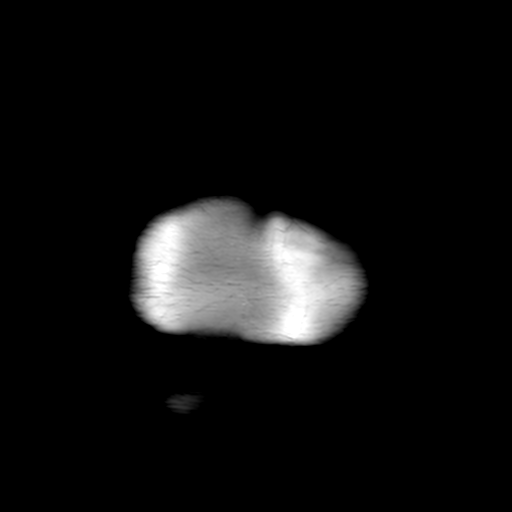

[Series 6: ax ssfse nav · axial · 6.0mm · 0.74mm/px · z∈[+108,+354]mm · 2 of 42 slices shown]
[im 1/42]
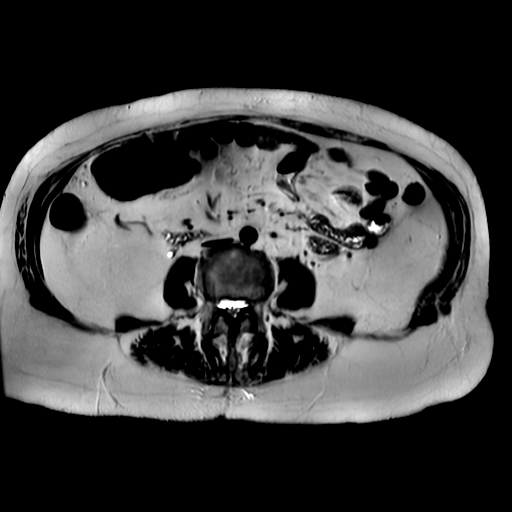
[im 42/42]
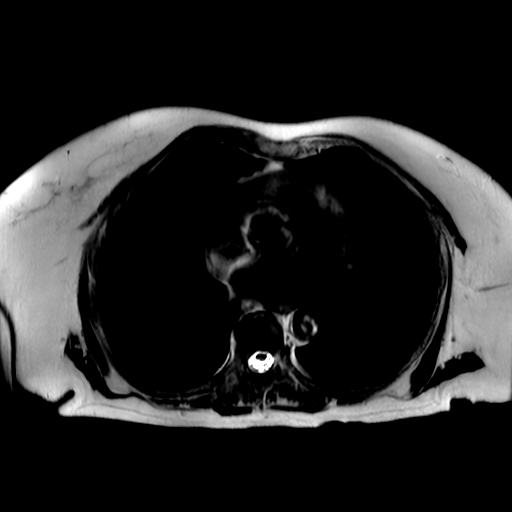

[Series 8: T2 fat-sat · axial · 6.0mm · 0.74mm/px · z∈[+109,+353]mm · 2 of 38 slices shown]
[im 1/38]
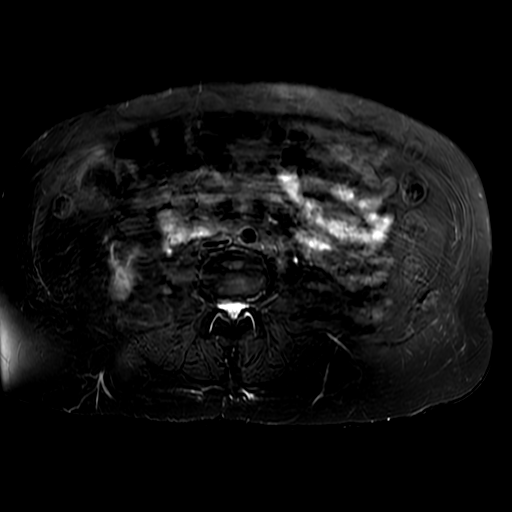
[im 38/38]
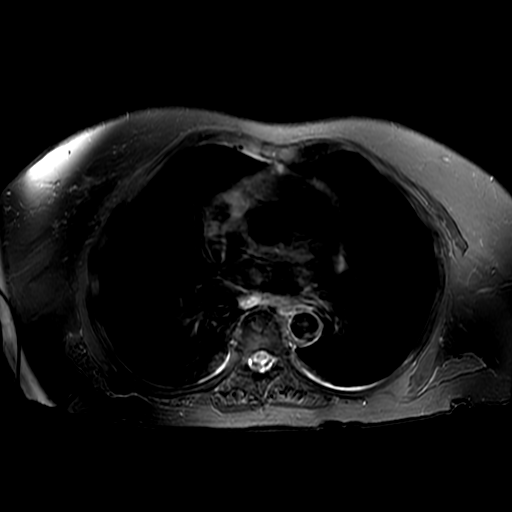

[Series 9: DWI b500 · axial · 8.0mm · 1.76mm/px · 1 of 50 slices shown]
[im 1/50]
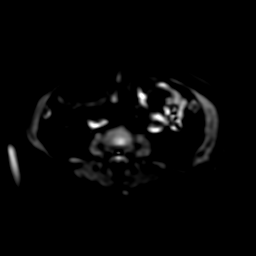

[Series 12: T1 dynamic · coronal · 3.4mm · 1.56mm/px · 4 of 144 slices shown]
[im 1/144]
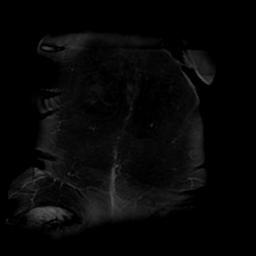
[im 48/144]
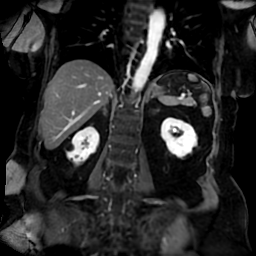
[im 96/144]
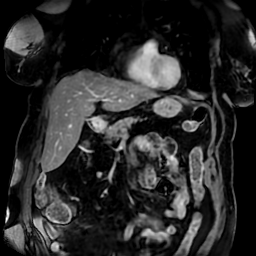
[im 144/144]
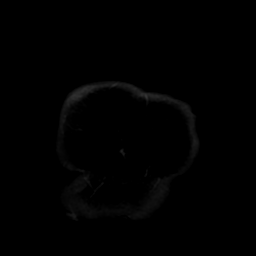

[Series 950: ADC · axial · 8.0mm · 1.76mm/px · 1 of 25 slices shown]
[im 1/25]
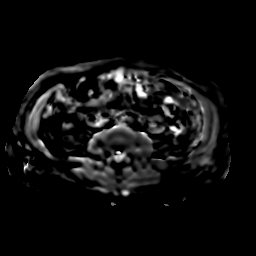

[Series 1100: T1 dynamic post-contrast · axial · non-contrast · 4.0mm · 0.86mm/px · z∈[+103,+357]mm · 3 of 128 slices shown (1 of 4)]
[im 1/128]
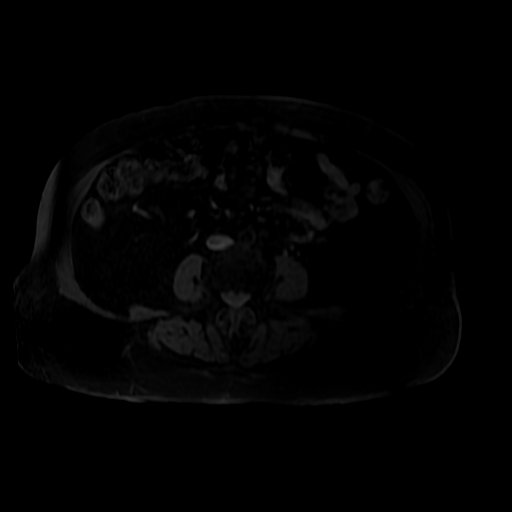
[im 64/128]
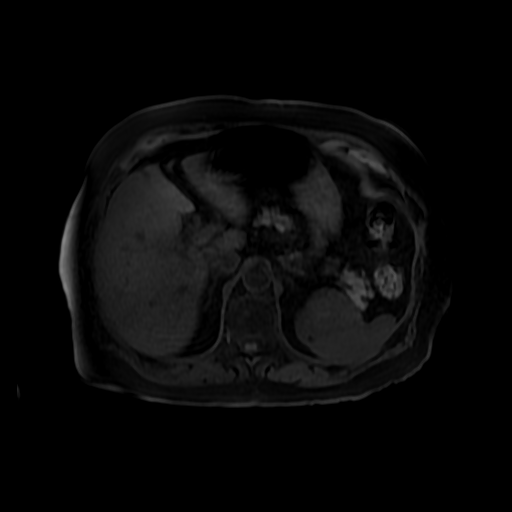
[im 128/128]
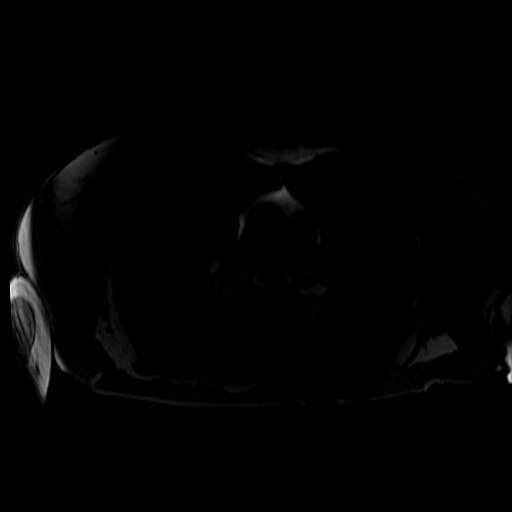

[Series 1101: T1 dynamic post-contrast · axial · non-contrast · 4.0mm · 0.86mm/px · z∈[+103,+357]mm · 3 of 128 slices shown (2 of 4)]
[im 1/128]
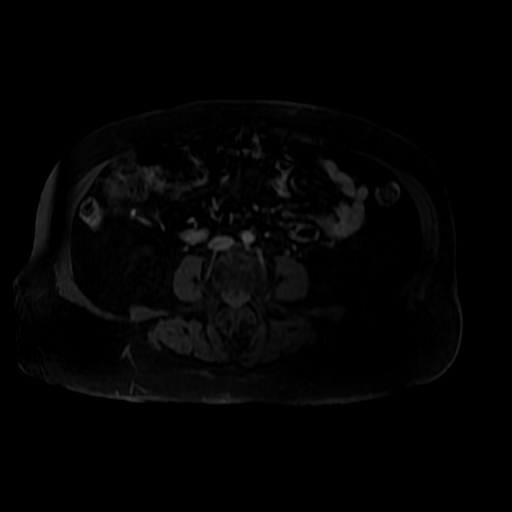
[im 64/128]
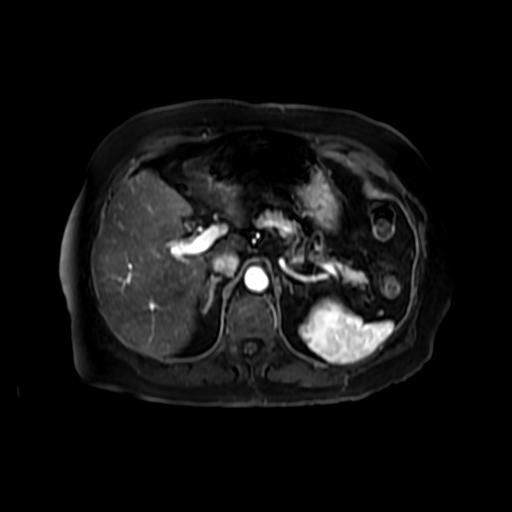
[im 128/128]
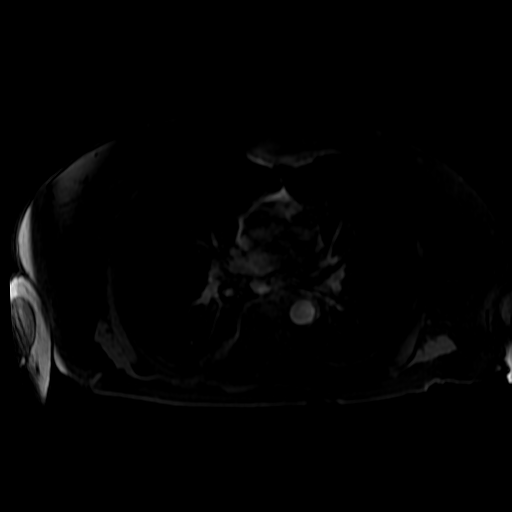

[Series 1102: T1 dynamic post-contrast · axial · non-contrast · 4.0mm · 0.86mm/px · z∈[+103,+357]mm · 3 of 128 slices shown (3 of 4)]
[im 1/128]
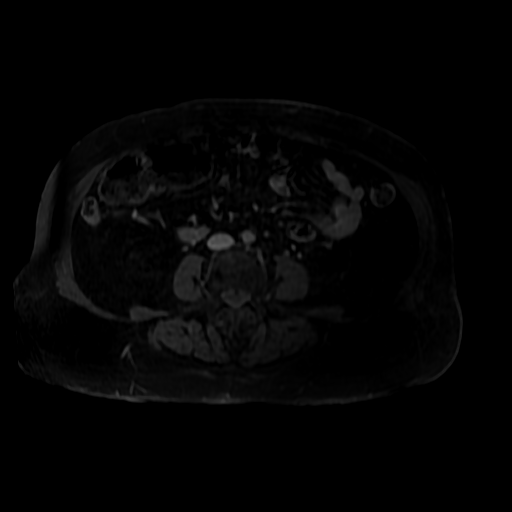
[im 64/128]
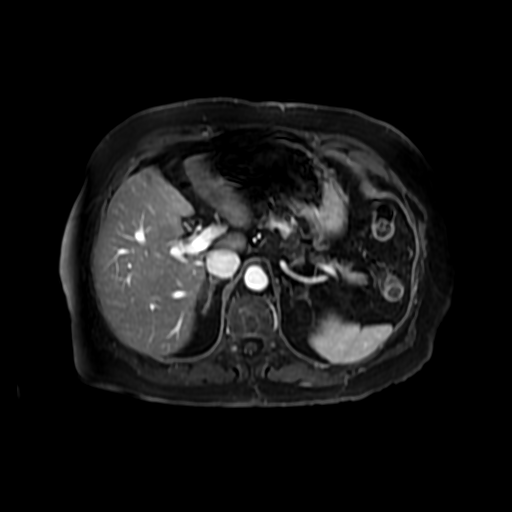
[im 128/128]
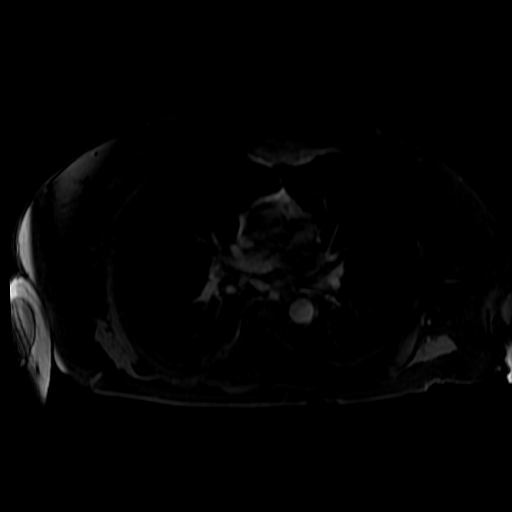

[Series 1103: T1 dynamic post-contrast · axial · non-contrast · 4.0mm · 0.86mm/px · 1 of 128 slices shown (4 of 4)]
[im 1/128]
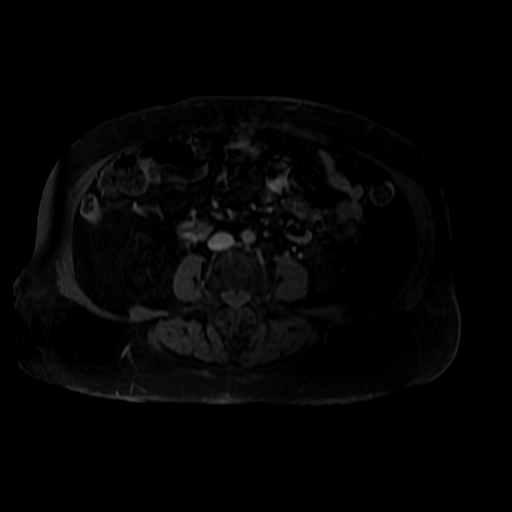

[22 of 48 positions shown; findings below may reference images not displayed]

FINDINGS: Lower chest: Incidental imaging of the lung bases is unremarkable.
Assessment is limited on MRI.

Hepatobiliary: Mild hepatic steatosis. Post cholecystectomy without
signs of ductal dilation.

Along the anterior margin of hepatic subsegment IV/V along the
inferior anterior margin of the gallbladder fossa a 19 by 11 mm
focus of hyperenhancement shows intrinsic T1 hyperintensity albeit
minimally and peripheral hemosiderin deposition and or
postprocedural changes/surgical manipulation. This area displays no
change compared to the [REDACTED] MRI.

Pancreas: Pancreas is normal without signs of peripancreatic
inflammation ductal dilation or lesion.

Spleen:  Spleen normal size without focal lesion.

Adrenals/Urinary Tract:  Adrenal glands are normal.

Bilateral renal cysts with renal cortical scarring and no evidence
of hydronephrosis.

Stomach/Bowel: Signs of prior fundoplication likely with partial
wrap/Toupe. No acute gastrointestinal process. Postoperative changes
in the anterior abdominal wall.

Vascular/Lymphatic: Patent abdominal vasculature including portal
vein. No portosystemic collaterals.

Other:  No ascites.

Musculoskeletal: No suspicious bone lesion.
IMPRESSION: 1. Hypervascular focus is unchanged along the anterior right hemi
liver. Perhaps this represents a vascular shunt lesion at the site
of previous liver trauma or partial hepatic resection. Correlate
clinically. This area has an atypical appearance for metastatic
disease. Given the marked hyperenhancement would suggest focused
ultrasound with real-time color Doppler and spectral Doppler
imaging. This could help to exclude a high flow lesion that would be
a risk to this patient in terms of future rupture or bleed. The
lesion does not behave as would be expected for a malignant process
given stability and given lack of washout, ultrasound may help to
guide further follow-up. Specify on exam "color and spectral Doppler
assessment of focal liver lesion" .
2. Mild hepatic steatosis.
3. Bilateral renal cysts with renal cortical scarring.
4. Status post cholecystectomy without signs of ductal dilation.

These results will be called to the ordering clinician or
representative by the Radiologist Assistant, and communication
documented in the PACS or [REDACTED].

## 2020-08-30 DIAGNOSIS — H43813 Vitreous degeneration, bilateral: Secondary | ICD-10-CM | POA: Diagnosis not present

## 2020-10-16 NOTE — Progress Notes (Signed)
Chart reviewed, agree above plan ?

## 2021-02-10 ENCOUNTER — Ambulatory Visit: Payer: Medicare Other | Admitting: Neurology

## 2021-02-10 ENCOUNTER — Other Ambulatory Visit: Payer: Self-pay

## 2021-02-10 ENCOUNTER — Ambulatory Visit (INDEPENDENT_AMBULATORY_CARE_PROVIDER_SITE_OTHER): Payer: Medicare Other | Admitting: Neurology

## 2021-02-10 ENCOUNTER — Encounter: Payer: Self-pay | Admitting: Neurology

## 2021-02-10 VITALS — BP 142/75 | HR 65 | Ht 67.0 in | Wt 179.0 lb

## 2021-02-10 DIAGNOSIS — R269 Unspecified abnormalities of gait and mobility: Secondary | ICD-10-CM | POA: Diagnosis not present

## 2021-02-10 DIAGNOSIS — F03B0 Unspecified dementia, moderate, without behavioral disturbance, psychotic disturbance, mood disturbance, and anxiety: Secondary | ICD-10-CM | POA: Insufficient documentation

## 2021-02-10 DIAGNOSIS — G43709 Chronic migraine without aura, not intractable, without status migrainosus: Secondary | ICD-10-CM

## 2021-02-10 MED ORDER — UBRELVY 50 MG PO TABS: 50.0000 mg | ORAL_TABLET | ORAL | 5 refills | Status: DC | PRN

## 2021-02-10 NOTE — Progress Notes (Signed)
Chief Complaint  Patient presents with   Follow-up    Memory: Feels stable.  Gait: Feels stable.   PCP: Salvadore Oxford, MD      ASSESSMENT AND PLAN  Laurie Atkinson is a 88 y.o. female   Chronic headache,  With migraine features, Ubrelvy as needed  ESR C-reactive protein, TSH today Dementia  MoCA examination 17/30 Gait abnormalities  Multifactorial  Refer to home physical therapy  DIAGNOSTIC DATA (LABS, IMAGING, TESTING) - I reviewed patient records, labs, notes, testing and imaging myself where available.   MEDICAL HISTORY:  Laurie Atkinson is a 88 year old female, seen in request by her primary care physician Dr. Selena Batten, Gildardo Griffes, MD for frequent migraine headaches, short-term memory loss  I reviewed and summarized the referring note.  Past medical history Hypertension Hypothyroidism   She reported a history of migraine headaches, her typical migraine are moderate to severe holoacranial severe pounding headache with associated light noise sensitivity, she has stopped having frequent migraine for a while   But since beginning of 2020, she began to have frequent headaches again, bilateral temporal region, ringing in her ears, also complains of neck pain, she is having frequent headaches, mild degree on a daily basis, couple times each week, she has to take Tylenol for headache   Daughter also reported that patient has gradual onset memory loss, increased confusion during UTI, she also complains of few years history of bilateral lower extremity bilateral feet numbness tingling, slow worsening gait abnormality, she has neck pain, radiating pain to left shoulder, she denies significant low back pain.   I personally reviewed CT cervical without contrast February 2017: Multilevel cervical degenerative changes, there was no significant canal stenosis, variable degree of foraminal narrowing   Laboratory evaluation in 2020, CBC showed hemoglobin of 11.7, normal TSH, CMP, B12 of  558   UPDATE Nov 4th 2020: She return for electrodiagnostic study today, which showed no significant abnormality, there is no evidence of large fiber peripheral neuropathy or bilateral lumbosacral radiculopathy   Laboratory evaluations showed normal ESR, C-reactive protein   She continue complains of frequent bilateral frontal headaches, similar to her migraine in the past, lasting couple hours, couple times each week, Aleve provide partial help   She also complains of bilateral feet paresthesia, tolerating gabapentin 300 mg every night,   Husband also reported she has gradual worsening mild memory loss, mildly unsteady gait, I personally reviewed MRI of the brain without contrast in October 2020: Mild supratentorium small vessel disease, mild generalized atrophy, no acute abnormality, single chronic microhemorrhage in the left frontal lobe.   UPDATE May 25 2019: She is accompanied by her husband at today's visit, continue complains of fatigue, daytime sleepiness, poor sleeping quality at nighttime, taking frequent naps during the day, also complains of worsening memory loss, frequent bilateral frontal temporal region headaches    we have personally reviewed MRI of the brain October 2020, generalized atrophy, mild to moderate supratentorium small vessel disease, chronic microhemorrhage at the left frontal lobe, no acute abnormality   Previous laboratory evaluation also showed normal ESR, C-reactive protein,   She has mild improvement with physical therapy   UPDATE Feb 10 2021: She is overall doing very well, stable, still go out with friends sometimes, has good appetite, still has frequent but mild headache lying down resting usually is helpful, she does have a long history of migraine headaches,  MoCA examination 17/30,  I personally reviewed MRI of cervical spine on April 26, 2020, C3-4  ACDF, no evidence of nerve root compression no spinal stenosis, multilevel mild degenerative changes,  no significant canal foraminal stenosis  MRI of the brain October 2020, mild small vessel disease, chronic microhemorrhage in the left frontal lobe, there was no acute abnormalities   PHYSICAL EXAM:   Vitals:   02/10/21 1334  BP: (!) 142/75  Pulse: 65  Weight: 179 lb (81.2 kg)  Height: '5\' 7"'  (1.702 m)   Not recorded     Body mass index is 28.04 kg/m.  PHYSICAL EXAMNIATION:  Gen: NAD, conversant, well nourised, well groomed                     Cardiovascular: Regular rate rhythm, no peripheral edema, warm, nontender. Eyes: Conjunctivae clear without exudates or hemorrhage Neck: Supple, no carotid bruits. Pulmonary: Clear to auscultation bilaterally   NEUROLOGICAL EXAM:  MENTAL STATUS: Speech:    Speech is normal; fluent and spontaneous with normal comprehension.  Cognition:     Montreal Cognitive Assessment  02/10/2021  Visuospatial/ Executive (0/5) 4  Naming (0/3) 3  Attention: Read list of digits (0/2) 1  Attention: Read list of letters (0/1) 1  Attention: Serial 7 subtraction starting at 100 (0/3) 3  Language: Repeat phrase (0/2) 1  Language : Fluency (0/1) 0  Abstraction (0/2) 2  Delayed Recall (0/5) 0  Orientation (0/6) 2  Total 17    CRANIAL NERVES: CN II: Visual fields are full to confrontation. Pupils are round equal and briskly reactive to light. CN III, IV, VI: extraocular movement are normal. No ptosis. CN V: Facial sensation is intact to light touch CN VII: Face is symmetric with normal eye closure  CN VIII: Hearing is normal to causal conversation. CN IX, X: Phonation is normal. CN XI: Head turning and shoulder shrug are intact  MOTOR: There is no pronator drift of out-stretched arms. Muscle bulk and tone are normal. Muscle strength is normal.  REFLEXES: Reflexes are 2+ and symmetric at the biceps, triceps, knees, and ankles. Plantar responses are flexor.  SENSORY: Intact to light touch, pinprick and vibratory sensation are intact in fingers  and toes.  COORDINATION: There is no trunk or limb dysmetria noted.  GAIT/STANCE: need to push up, wide based, mildly unsteady  REVIEW OF SYSTEMS:  Full 14 system review of systems performed and notable only for as above All other review of systems were negative.   ALLERGIES: Allergies  Allergen Reactions   Morphine And Related Hives   Oxycodone Itching   Percocet [Oxycodone-Acetaminophen] Itching   Ambien [Zolpidem] Anxiety    HOME MEDICATIONS: Current Outpatient Medications  Medication Sig Dispense Refill   acetaminophen (TYLENOL) 500 MG tablet Take 1,000 mg by mouth every 6 (six) hours as needed for moderate pain or headache.      alendronate (FOSAMAX) 70 MG tablet Take 70 mg by mouth once a week.     amLODipine (NORVASC) 5 MG tablet Take 5 mg by mouth daily.     Apoaequorin (PREVAGEN PO) Take 1 tablet by mouth daily.     aspirin 81 MG tablet Take 81 mg by mouth at bedtime.      diphenhydrAMINE (BENADRYL) 25 mg capsule Take 25 mg by mouth daily as needed for allergies.      donepezil (ARICEPT) 10 MG tablet Take 1 tablet (10 mg total) by mouth at bedtime. 30 tablet 11   Fe Fum-FA-B Cmp-C-Zn-Mg-Mn-Cu (HEMOCYTE PLUS) 106-1 MG CAPS Take 1 capsule by mouth daily.     gabapentin (  NEURONTIN) 300 MG capsule Take 300 mg by mouth 2 (two) times daily.     levothyroxine (SYNTHROID) 25 MCG tablet Take 25 mcg by mouth daily before breakfast.     memantine (NAMENDA) 10 MG tablet Take 1 tablet (10 mg total) by mouth 2 (two) times daily. 180 tablet 4   Multiple Vitamins-Minerals (CENTRUM SILVER 50+WOMEN PO) Take 1 tablet by mouth daily.     nortriptyline (PAMELOR) 10 MG capsule Take 1 capsule (10 mg total) by mouth at bedtime. 90 capsule 3   olmesartan (BENICAR) 20 MG tablet Take 20 mg by mouth daily.     omeprazole (PRILOSEC) 20 MG capsule Take 20 mg by mouth daily.     Probiotic Product (PROBIOTIC DAILY PO) Take 1 capsule by mouth 2 (two) times daily.      traMADol (ULTRAM) 50 MG tablet  Take 1 tablet (50 mg total) by mouth every 6 (six) hours as needed (for acute migraine). 30 tablet 0   Ubrogepant (UBRELVY) 50 MG TABS Take 50 mg by mouth as needed (take 1 at onset of migraine, may repeat 2 hours after intial dose). 12 tablet 5   verapamil (CALAN-SR) 180 MG CR tablet Take 1 tablet (180 mg total) by mouth at bedtime. 30 tablet 11   No current facility-administered medications for this visit.    PAST MEDICAL HISTORY: Past Medical History:  Diagnosis Date   Abnormality of gait    Aneurysm of renal artery (HCC)    Anxiety state, unspecified    Arthritis    knees & neck    Complication of anesthesia 1980's   decreased BP from medicine relative anesth. & her surgery got cancelled , but had surgery the next week, with no problems    Coronary artery disease    Depression    Diplopia    GERD (gastroesophageal reflux disease)    HIATAL HERNIA   H/O echocardiogram 2014   [place]. for ? indigestion, told that it was wnl   Headache    distant history of   History of blood transfusion    with colon surgery    Hypercalcemia    Hypertension    Hypothyroidism    Memory loss    Migraine    Myocardial infarction [facility]) 1960's    ? when   Neuropathy    Other specified disorder of peritoneum    Renal artery aneurysm [employer]) March 2015   Unspecified disorder of kidney and ureter     PAST SURGICAL HISTORY: Past Surgical History:  Procedure Laterality Date   ABDOMINAL HYSTERECTOMY     ANTERIOR CERVICAL DECOMP/DISCECTOMY FUSION N/A 04/05/2015   Procedure: Anterior Cervical Discectomy Decompression Fusion - Cervical three-Cervical four ;  Surgeon: Eustace Moore, MD;  Location: [facility];  Service: Neurosurgery;  Laterality: N/A;   APPENDECTOMY     BACK SURGERY     CARDIAC CATHETERIZATION  1960's?   told- no blockages    CATARACT EXTRACTION W/PHACO Left 06/16/2017   Procedure: CATARACT EXTRACTION PHACO AND INTRAOCULAR LENS PLACEMENT (IOC);  Surgeon: Birder Robson,  MD;  Location: [facility] ORS;  Service: Ophthalmology;  Laterality: Left;  [place] 00:46 AP% 14.1 CDE 6.56 Fluid pack lot # [id number]   CATARACT EXTRACTION W/PHACO Right 07/27/2017   Procedure: CATARACT EXTRACTION PHACO AND INTRAOCULAR LENS PLACEMENT (IOC);  Surgeon: Birder Robson, MD;  Location: [facility] ORS;  Service: Ophthalmology;  Laterality: Right;  [place] 00:32.3 AP% 12.7 CDE 4.08 Fluid Pack Lot # [id number]   CHOLECYSTECTOMY  COLON SURGERY     ESOPHAGOGASTRODUODENOSCOPY  04/07/2017   Small gastric erosion (biopsied). Status post nissen fundoplication   HAND SURGERY     thumb joint replacement    HERNIA REPAIR  58/0998   Umbilical/ HIATAL   TONSILLECTOMY      FAMILY HISTORY: Family History  Problem Relation Age of Onset   Heart disease Mother        Heart Disease before age 52   Stroke Mother        67   Heart attack Mother    Cancer Father        Throat   Diabetes Father    Heart attack Father    Cancer Sister        Breast, Brain, Kidney   Heart disease Brother        Pacemaker- Before age 58   Heart attack Brother    Diabetes Sister     SOCIAL HISTORY: Social History   Socioeconomic History   Marital status: Married    Spouse name: Not on file   Number of children: 3   Years of education: 12   Highest education level: High school graduate  Occupational History   Occupation: Retired  Tobacco Use   Smoking status: Never   Smokeless tobacco: Never  Scientific laboratory technician Use: Never used  Substance and Sexual Activity   Alcohol use: No   Drug use: No   Sexual activity: Not on file  Other Topics Concern   Not on file  Social History Narrative   Lives at home with her husband.   Right-handed.   Two 16-oz bottles of soda per day.    Social Determinants of Health   Financial Resource Strain: Not on file  Food Insecurity: Not on file  Transportation Needs: Not on file  Physical Activity: Not on file  Stress: Not on file  Social Connections: Not on file   Intimate Partner Violence: Not on file      Marcial Pacas, M.D. Ph.D.  [facility] [address] [place], [place] [address] Ph: [phone] Fax: [phone]  CC:  Maris Berger, MD [address] [place],  [place] [address]  Maris Berger, MD

## 2021-02-11 LAB — SEDIMENTATION RATE: Sed Rate: 36 mm/hr (ref 0–40)

## 2021-02-11 LAB — TSH: TSH: 1.59 u[IU]/mL (ref 0.450–4.500)

## 2021-02-11 LAB — C-REACTIVE PROTEIN: CRP: 2 mg/L (ref 0–10)

## 2021-02-15 ENCOUNTER — Encounter: Payer: Self-pay | Admitting: Neurology

## 2021-02-17 ENCOUNTER — Telehealth: Payer: Self-pay | Admitting: Neurology

## 2021-02-17 NOTE — Telephone Encounter (Signed)
Referral to home health has been sent to Angie w/ [facility]. They will review and let me know if they can accept. °

## 2021-02-24 NOTE — Telephone Encounter (Signed)
Angie informed me that [facility] can't accept patient's referral due to her living in [place], [place]. I have reached out to [place] w/ [facility] to see if they can help.

## 2021-02-25 NOTE — Telephone Encounter (Signed)
Referral sent to [facility] Phone: [phone].

## 2021-02-25 NOTE — Telephone Encounter (Addendum)
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 She now has [facility] Medicare. Otherwise, [facility] was going to accept her.  We do not service [place] therefore we cannot help. I would check with [facility] out of [facility]... That's your last hope it sounds like due to where patient lives.

## 2021-02-26 NOTE — Telephone Encounter (Signed)
Ross Marcus from [facility] called stating that they will be able to take the pt and they will be seeing her on Friday the 27th. If any questions please call her back on [phone]

## 2021-05-08 ENCOUNTER — Other Ambulatory Visit: Payer: Self-pay | Admitting: Neurology

## 2021-07-09 ENCOUNTER — Telehealth: Payer: Self-pay | Admitting: *Deleted

## 2021-07-09 NOTE — Telephone Encounter (Signed)
Completed Roselyn Meier PA on Cover My Meds. Key: [id number]. Awaiting determination from [facility].

## 2021-07-10 NOTE — Telephone Encounter (Signed)
PA approved from 07/09/21 through 07/10/22.

## 2021-08-12 ENCOUNTER — Other Ambulatory Visit: Payer: Self-pay | Admitting: Neurology

## 2021-08-13 NOTE — Progress Notes (Deleted)
Patient: Laurie Atkinson Date of Birth: August 12, 1933  Reason for Visit: Follow up History from: Patient Primary Neurologist:    ASSESSMENT AND PLAN 88 y.o. year old female   46.  Chronic headache -CRP, ESR were normal 2.  Dementia -MoCA was 3.  Gait abnormality  HISTORY  Laurie Atkinson is a 88 year old female, seen in request by her primary care physician Dr. Selena Batten, Gildardo Griffes, MD for frequent migraine headaches, short-term memory loss   I reviewed and summarized the referring note.  Past medical history Hypertension Hypothyroidism   She reported a history of migraine headaches, her typical migraine are moderate to severe holoacranial severe pounding headache with associated light noise sensitivity, she has stopped having frequent migraine for a while   But since beginning of 2020, she began to have frequent headaches again, bilateral temporal region, ringing in her ears, also complains of neck pain, she is having frequent headaches, mild degree on a daily basis, couple times each week, she has to take Tylenol for headache   Daughter also reported that patient has gradual onset memory loss, increased confusion during UTI, she also complains of few years history of bilateral lower extremity bilateral feet numbness tingling, slow worsening gait abnormality, she has neck pain, radiating pain to left shoulder, she denies significant low back pain.   I personally reviewed CT cervical without contrast February 2017: Multilevel cervical degenerative changes, there was no significant canal stenosis, variable degree of foraminal narrowing   Laboratory evaluation in 2020, CBC showed hemoglobin of 11.7, normal TSH, CMP, B12 of 558   UPDATE Nov 4th 2020: She return for electrodiagnostic study today, which showed no significant abnormality, there is no evidence of large fiber peripheral neuropathy or bilateral lumbosacral radiculopathy   Laboratory evaluations showed normal ESR, C-reactive  protein   She continue complains of frequent bilateral frontal headaches, similar to her migraine in the past, lasting couple hours, couple times each week, Aleve provide partial help   She also complains of bilateral feet paresthesia, tolerating gabapentin 300 mg every night,   Husband also reported she has gradual worsening mild memory loss, mildly unsteady gait, I personally reviewed MRI of the brain without contrast in October 2020: Mild supratentorium small vessel disease, mild generalized atrophy, no acute abnormality, single chronic microhemorrhage in the left frontal lobe.   UPDATE May 25 2019: She is accompanied by her husband at today's visit, continue complains of fatigue, daytime sleepiness, poor sleeping quality at nighttime, taking frequent naps during the day, also complains of worsening memory loss, frequent bilateral frontal temporal region headaches    we have personally reviewed MRI of the brain October 2020, generalized atrophy, mild to moderate supratentorium small vessel disease, chronic microhemorrhage at the left frontal lobe, no acute abnormality   Previous laboratory evaluation also showed normal ESR, C-reactive protein,   She has mild improvement with physical therapy    UPDATE Feb 10 2021: She is overall doing very well, stable, still go out with friends sometimes, has good appetite, still has frequent but mild headache lying down resting usually is helpful, she does have a long history of migraine headaches,   MoCA examination 17/30,   I personally reviewed MRI of cervical spine on April 26, 2020, C3-4 ACDF, no evidence of nerve root compression no spinal stenosis, multilevel mild degenerative changes, no significant canal foraminal stenosis  MRI of the brain October 2020, mild small vessel disease, chronic microhemorrhage in the left frontal lobe, there was no acute  abnormalities  Update August 14, 2021 SS:       REVIEW OF SYSTEMS: Out of a complete 14  system review of symptoms, the patient complains only of the following symptoms, and all other reviewed systems are negative.  See HPI  ALLERGIES: Allergies  Allergen Reactions   Morphine And Related Hives   Oxycodone Itching   Percocet [Oxycodone-Acetaminophen] Itching   Ambien [Zolpidem] Anxiety    HOME MEDICATIONS: Outpatient Medications Prior to Visit  Medication Sig Dispense Refill   acetaminophen (TYLENOL) 500 MG tablet Take 1,000 mg by mouth every 6 (six) hours as needed for moderate pain or headache.      alendronate (FOSAMAX) 70 MG tablet Take 70 mg by mouth once a week.     amLODipine (NORVASC) 5 MG tablet Take 5 mg by mouth daily.     Apoaequorin (PREVAGEN PO) Take 1 tablet by mouth daily.     aspirin 81 MG tablet Take 81 mg by mouth at bedtime.      diphenhydrAMINE (BENADRYL) 25 mg capsule Take 25 mg by mouth daily as needed for allergies.      donepezil (ARICEPT) 10 MG tablet Take 1 tablet (10 mg total) by mouth at bedtime. 30 tablet 11   Fe Fum-FA-B Cmp-C-Zn-Mg-Mn-Cu (HEMOCYTE PLUS) 106-1 MG CAPS Take 1 capsule by mouth daily.     gabapentin (NEURONTIN) 300 MG capsule Take 300 mg by mouth 2 (two) times daily.     levothyroxine (SYNTHROID) 25 MCG tablet Take 25 mcg by mouth daily before breakfast.     memantine (NAMENDA) 10 MG tablet Take 1 tablet (10 mg total) by mouth 2 (two) times daily. 180 tablet 4   Multiple Vitamins-Minerals (CENTRUM SILVER 50+WOMEN PO) Take 1 tablet by mouth daily.     nortriptyline (PAMELOR) 10 MG capsule Take 1 capsule (10 mg total) by mouth at bedtime. 90 capsule 3   olmesartan (BENICAR) 20 MG tablet Take 20 mg by mouth daily.     omeprazole (PRILOSEC) 20 MG capsule Take 20 mg by mouth daily.     Probiotic Product (PROBIOTIC DAILY PO) Take 1 capsule by mouth 2 (two) times daily.      traMADol (ULTRAM) 50 MG tablet Take 1 tablet (50 mg total) by mouth every 6 (six) hours as needed (for acute migraine). 30 tablet 0   Ubrogepant (UBRELVY) 50 MG  TABS Take 50 mg by mouth as needed (take 1 at onset of migraine, may repeat 2 hours after intial dose). 12 tablet 5   verapamil (CALAN-SR) 180 MG CR tablet Take 1 tablet (180 mg total) by mouth at bedtime. 30 tablet 11   No facility-administered medications prior to visit.    PAST MEDICAL HISTORY: Past Medical History:  Diagnosis Date   Abnormality of gait    Aneurysm of renal artery (HCC)    Anxiety state, unspecified    Arthritis    knees & neck    Complication of anesthesia 1980's   decreased BP from medicine relative anesth. & her surgery got cancelled , but had surgery the next week, with no problems    Coronary artery disease    Depression    Diplopia    GERD (gastroesophageal reflux disease)    HIATAL HERNIA   H/O echocardiogram 2014   [place]. for ? indigestion, told that it was wnl   Headache    distant history of   History of blood transfusion    with colon surgery    Hypercalcemia  Hypertension    Hypothyroidism    Memory loss    Migraine    Myocardial infarction [facility]) 1960's    ? when   Neuropathy    Other specified disorder of peritoneum    Renal artery aneurysm [facility]) March 2015   Unspecified disorder of kidney and ureter     PAST SURGICAL HISTORY: Past Surgical History:  Procedure Laterality Date   ABDOMINAL HYSTERECTOMY     ANTERIOR CERVICAL DECOMP/DISCECTOMY FUSION N/A 04/05/2015   Procedure: Anterior Cervical Discectomy Decompression Fusion - Cervical three-Cervical four ;  Surgeon: Eustace Moore, MD;  Location: [facility];  Service: Neurosurgery;  Laterality: N/A;   APPENDECTOMY     BACK SURGERY     CARDIAC CATHETERIZATION  1960's?   told- no blockages    CATARACT EXTRACTION W/PHACO Left 06/16/2017   Procedure: CATARACT EXTRACTION PHACO AND INTRAOCULAR LENS PLACEMENT (IOC);  Surgeon: Birder Robson, MD;  Location: [facility] ORS;  Service: Ophthalmology;  Laterality: Left;  [place] 00:46 AP% 14.1 CDE 6.56 Fluid pack lot # [id number]   CATARACT  EXTRACTION W/PHACO Right 07/27/2017   Procedure: CATARACT EXTRACTION PHACO AND INTRAOCULAR LENS PLACEMENT (IOC);  Surgeon: Birder Robson, MD;  Location: [facility] ORS;  Service: Ophthalmology;  Laterality: Right;  [place] 00:32.3 AP% 12.7 CDE 4.08 Fluid Pack Lot # [id number]   CHOLECYSTECTOMY     COLON SURGERY     ESOPHAGOGASTRODUODENOSCOPY  04/07/2017   Small gastric erosion (biopsied). Status post nissen fundoplication   HAND SURGERY     thumb joint replacement    HERNIA REPAIR  26/9485   Umbilical/ HIATAL   TONSILLECTOMY      FAMILY HISTORY: Family History  Problem Relation Age of Onset   Heart disease Mother        Heart Disease before age 67   Stroke Mother        22   Heart attack Mother    Cancer Father        Throat   Diabetes Father    Heart attack Father    Cancer Sister        Breast, Brain, Kidney   Heart disease Brother        Pacemaker- Before age 59   Heart attack Brother    Diabetes Sister     SOCIAL HISTORY: Social History   Socioeconomic History   Marital status: Married    Spouse name: Not on file   Number of children: 3   Years of education: 12   Highest education level: High school graduate  Occupational History   Occupation: Retired  Tobacco Use   Smoking status: Never   Smokeless tobacco: Never  Scientific laboratory technician Use: Never used  Substance and Sexual Activity   Alcohol use: No   Drug use: No   Sexual activity: Not on file  Other Topics Concern   Not on file  Social History Narrative   Lives at home with her husband.   Right-handed.   Two 16-oz bottles of soda per day.    Social Determinants of Health   Financial Resource Strain: Not on file  Food Insecurity: Not on file  Transportation Needs: Not on file  Physical Activity: Not on file  Stress: Not on file  Social Connections: Not on file  Intimate Partner Violence: Not on file    PHYSICAL EXAM  There were no vitals filed for this visit. There is no height or weight on  file to calculate BMI.  Generalized: Well developed,  in no acute distress  Neurological examination  Mentation: Alert oriented to time, place, history taking. Follows all commands speech and language fluent Cranial nerve II-XII: Pupils were equal round reactive to light. Extraocular movements were full, visual field were full on confrontational test. Facial sensation and strength were normal. Uvula tongue midline. Head turning and shoulder shrug  were normal and symmetric. Motor: The motor testing reveals 5 over 5 strength of all 4 extremities. Good symmetric motor tone is noted throughout.  Sensory: Sensory testing is intact to soft touch on all 4 extremities. No evidence of extinction is noted.  Coordination: Cerebellar testing reveals good finger-nose-finger and heel-to-shin bilaterally.  Gait and station: Gait is normal. Tandem gait is normal. Romberg is negative. No drift is seen.  Reflexes: Deep tendon reflexes are symmetric and normal bilaterally.   DIAGNOSTIC DATA (LABS, IMAGING, TESTING) - I reviewed patient records, labs, notes, testing and imaging myself where available.  Lab Results  Component Value Date   WBC 5.2 03/29/2015   HGB 11.9 (L) 03/29/2015   HCT 36.9 03/29/2015   MCV 85.4 03/29/2015   PLT 157 03/29/2015      Component Value Date/Time   NA 137 02/18/2017 1143   K 4.3 02/18/2017 1143   CL 103 02/18/2017 1143   CO2 27 02/18/2017 1143   GLUCOSE 78 02/18/2017 1143   BUN 17 02/18/2017 1143   CREATININE 0.93 02/18/2017 1143   CREATININE 1.47 (H) 10/13/2013 1546   CALCIUM 10.7 (H) 02/18/2017 1143   PROT 7.0 02/18/2017 1143   PROT 6.6 05/15/2013 1030   ALBUMIN 4.2 02/18/2017 1143   AST 19 02/18/2017 1143   ALT 13 02/18/2017 1143   ALKPHOS 105 02/18/2017 1143   BILITOT 0.3 02/18/2017 1143   GFRNONAA 43 (L) 03/29/2015 1258   GFRAA 50 (L) 03/29/2015 1258   No results found for: "CHOL", "HDL", "LDLCALC", "LDLDIRECT", "TRIG", "CHOLHDL" No results found for:  "HGBA1C" No results found for: "VITAMINB12" Lab Results  Component Value Date   TSH 1.590 02/10/2021    Butler Denmark, AGNP-C, DNP 08/13/2021, 8:46 AM [facility] [address] [place], [place] [address] [phone]

## 2021-08-14 ENCOUNTER — Ambulatory Visit: Payer: Medicare Other | Admitting: Neurology

## 2021-09-28 ENCOUNTER — Other Ambulatory Visit: Payer: Self-pay | Admitting: Neurology

## 2021-10-07 NOTE — Telephone Encounter (Signed)
Rx has been sent to avoid disruption of care.

## 2021-10-09 NOTE — Progress Notes (Unsigned)
Patient: Laurie Atkinson Date of Birth: November 03, 1933  Reason for Visit: Follow up History from: Patient, husband Primary Neurologist: Dr. Krista Blue   ASSESSMENT AND PLAN 88 y.o. year old female   1.  Chronic headache -Under good control, on average 1 a month -Continue verapamil, nortriptyline for headache prevention -Use Ubrelvy 50 mg as needed for acute headache treatment  2.  Dementia -[facility] 16/30, overall stable -Continue Aricept, Namenda  3.  Gait abnormality -Multifactorial, recently worsened with UTI, is improving, following up with PCP today  She will follow-up with me in 6 months, they are in the process of selling their home, moving in with her daughter.  HISTORY  Laurie Atkinson is a 88 year old female, seen in request by her primary care physician Dr. Selena Batten, Gildardo Griffes, MD for frequent migraine headaches, short-term memory loss   I reviewed and summarized the referring note.  Past medical history Hypertension Hypothyroidism   She reported a history of migraine headaches, her typical migraine are moderate to severe holoacranial severe pounding headache with associated light noise sensitivity, she has stopped having frequent migraine for a while   But since beginning of 2020, she began to have frequent headaches again, bilateral temporal region, ringing in her ears, also complains of neck pain, she is having frequent headaches, mild degree on a daily basis, couple times each week, she has to take Tylenol for headache   Daughter also reported that patient has gradual onset memory loss, increased confusion during UTI, she also complains of few years history of bilateral lower extremity bilateral feet numbness tingling, slow worsening gait abnormality, she has neck pain, radiating pain to left shoulder, she denies significant low back pain.   I personally reviewed CT cervical without contrast February 2017: Multilevel cervical degenerative changes, there was no significant canal  stenosis, variable degree of foraminal narrowing   Laboratory evaluation in 2020, CBC showed hemoglobin of 11.7, normal TSH, CMP, B12 of 558   UPDATE Nov 4th 2020: She return for electrodiagnostic study today, which showed no significant abnormality, there is no evidence of large fiber peripheral neuropathy or bilateral lumbosacral radiculopathy   Laboratory evaluations showed normal ESR, C-reactive protein   She continue complains of frequent bilateral frontal headaches, similar to her migraine in the past, lasting couple hours, couple times each week, Aleve provide partial help   She also complains of bilateral feet paresthesia, tolerating gabapentin 300 mg every night,   Husband also reported she has gradual worsening mild memory loss, mildly unsteady gait, I personally reviewed MRI of the brain without contrast in October 2020: Mild supratentorium small vessel disease, mild generalized atrophy, no acute abnormality, single chronic microhemorrhage in the left frontal lobe.   UPDATE May 25 2019: She is accompanied by her husband at today's visit, continue complains of fatigue, daytime sleepiness, poor sleeping quality at nighttime, taking frequent naps during the day, also complains of worsening memory loss, frequent bilateral frontal temporal region headaches    we have personally reviewed MRI of the brain October 2020, generalized atrophy, mild to moderate supratentorium small vessel disease, chronic microhemorrhage at the left frontal lobe, no acute abnormality   Previous laboratory evaluation also showed normal ESR, C-reactive protein,   She has mild improvement with physical therapy    UPDATE Feb 10 2021: She is overall doing very well, stable, still go out with friends sometimes, has good appetite, still has frequent but mild headache lying down resting usually is helpful, she does have a long  history of migraine headaches,   [facility] examination 17/30,   I personally reviewed MRI  of cervical spine on April 26, 2020, C3-4 ACDF, no evidence of nerve root compression no spinal stenosis, multilevel mild degenerative changes, no significant canal foraminal stenosis  MRI of the brain October 2020, mild small vessel disease, chronic microhemorrhage in the left frontal lobe, there was no acute abnormalities  Update October 14, 2021 SS: [facility] 16/30 today, here with husband.  At last visit in January 2023 ESR, CRP were normal, TSH 1.590. Few weeks UTI, finished antibiotic, now feels itching, started with gait instability. Gait has improved since treatment, seeing PCP today to follow up. No falls. Headaches doing well, about 1 migraine a month.  Has been out of Ubrelvy, it did help.  Husband manages medications, seems very good with this. Headaches are much better than before. On Aricept, Namenda, nortriptyline, verapamil. She is independent, she cooks, cleans. Is selling their home, moving in with their daughter. She doesn't drive.    REVIEW OF SYSTEMS: Out of a complete 14 system review of symptoms, the patient complains only of the following symptoms, and all other reviewed systems are negative.  See HPI  ALLERGIES: Allergies  Allergen Reactions   Morphine And Related Hives   Oxycodone Itching   Percocet [Oxycodone-Acetaminophen] Itching   Ambien [Zolpidem] Anxiety    HOME MEDICATIONS: Outpatient Medications Prior to Visit  Medication Sig Dispense Refill   acetaminophen (TYLENOL) 500 MG tablet Take 1,000 mg by mouth every 6 (six) hours as needed for moderate pain or headache.      alendronate (FOSAMAX) 70 MG tablet Take 70 mg by mouth once a week.     amLODipine (NORVASC) 5 MG tablet Take 5 mg by mouth daily.     Apoaequorin (PREVAGEN PO) Take 1 tablet by mouth daily.     aspirin 81 MG tablet Take 81 mg by mouth at bedtime.      diphenhydrAMINE (BENADRYL) 25 mg capsule Take 25 mg by mouth daily as needed for allergies.      donepezil (ARICEPT) 10 MG tablet Take 1 tablet  (10 mg total) by mouth at bedtime. 30 tablet 11   gabapentin (NEURONTIN) 300 MG capsule Take 300 mg by mouth 2 (two) times daily.     levothyroxine (SYNTHROID) 25 MCG tablet Take 25 mcg by mouth daily before breakfast.     memantine (NAMENDA) 10 MG tablet Take 1 tablet (10 mg total) by mouth 2 (two) times daily. 180 tablet 4   Multiple Vitamins-Minerals (CENTRUM SILVER 50+WOMEN PO) Take 1 tablet by mouth daily.     nortriptyline (PAMELOR) 10 MG capsule TAKE 1 CAPSULE BY MOUTH AT BEDTIME. 90 capsule 3   olmesartan (BENICAR) 20 MG tablet Take 20 mg by mouth daily.     omeprazole (PRILOSEC) 20 MG capsule Take 20 mg by mouth daily.     Probiotic Product (PROBIOTIC DAILY PO) Take 1 capsule by mouth 2 (two) times daily.      traMADol (ULTRAM) 50 MG tablet Take 1 tablet (50 mg total) by mouth every 6 (six) hours as needed (for acute migraine). 30 tablet 0   verapamil (CALAN-SR) 180 MG CR tablet Take 1 tablet (180 mg total) by mouth at bedtime. 30 tablet 11   Fe Fum-FA-B Cmp-C-Zn-Mg-Mn-Cu (HEMOCYTE PLUS) 106-1 MG CAPS Take 1 capsule by mouth daily.     Ubrogepant (UBRELVY) 50 MG TABS Take 50 mg by mouth as needed (take 1 at onset of migraine, may repeat  2 hours after intial dose). 12 tablet 5   No facility-administered medications prior to visit.    PAST MEDICAL HISTORY: Past Medical History:  Diagnosis Date   Abnormality of gait    Aneurysm of renal artery (HCC)    Anxiety state, unspecified    Arthritis    knees & neck    Complication of anesthesia 1980's   decreased BP from medicine relative anesth. & her surgery got cancelled , but had surgery the next week, with no problems    Coronary artery disease    Depression    Diplopia    GERD (gastroesophageal reflux disease)    HIATAL HERNIA   H/O echocardiogram 2014   [facility]. for ? indigestion, told that it was wnl   Headache    distant history of   History of blood transfusion    with colon surgery    Hypercalcemia    Hypertension     Hypothyroidism    Memory loss    Migraine    Myocardial infarction [facility]) 1960's    ? when   Neuropathy    Other specified disorder of peritoneum    Renal artery aneurysm [facility]) March 2015   Unspecified disorder of kidney and ureter     PAST SURGICAL HISTORY: Past Surgical History:  Procedure Laterality Date   ABDOMINAL HYSTERECTOMY     ANTERIOR CERVICAL DECOMP/DISCECTOMY FUSION N/A 04/05/2015   Procedure: Anterior Cervical Discectomy Decompression Fusion - Cervical three-Cervical four ;  Surgeon: Eustace Moore, MD;  Location: [facility];  Service: Neurosurgery;  Laterality: N/A;   APPENDECTOMY     BACK SURGERY     CARDIAC CATHETERIZATION  1960's?   told- no blockages    CATARACT EXTRACTION W/PHACO Left 06/16/2017   Procedure: CATARACT EXTRACTION PHACO AND INTRAOCULAR LENS PLACEMENT (IOC);  Surgeon: Birder Robson, MD;  Location: [facility] ORS;  Service: Ophthalmology;  Laterality: Left;  [place] 00:46 AP% 14.1 CDE 6.56 Fluid pack lot # [id number]   CATARACT EXTRACTION W/PHACO Right 07/27/2017   Procedure: CATARACT EXTRACTION PHACO AND INTRAOCULAR LENS PLACEMENT (IOC);  Surgeon: Birder Robson, MD;  Location: [facility] ORS;  Service: Ophthalmology;  Laterality: Right;  [place] 00:32.3 AP% 12.7 CDE 4.08 Fluid Pack Lot # [id number]   CHOLECYSTECTOMY     COLON SURGERY     ESOPHAGOGASTRODUODENOSCOPY  04/07/2017   Small gastric erosion (biopsied). Status post nissen fundoplication   HAND SURGERY     thumb joint replacement    HERNIA REPAIR  19/1478   Umbilical/ HIATAL   TONSILLECTOMY      FAMILY HISTORY: Family History  Problem Relation Age of Onset   Heart disease Mother        Heart Disease before age 86   Stroke Mother        1965   Heart attack Mother    Cancer Father        Throat   Diabetes Father    Heart attack Father    Cancer Sister        Breast, Brain, Kidney   Heart disease Brother        Pacemaker- Before age 54   Heart attack Brother    Diabetes Sister      SOCIAL HISTORY: Social History   Socioeconomic History   Marital status: Married    Spouse name: Not on file   Number of children: 3   Years of education: 12   Highest education level: High school graduate  Occupational History   Occupation:  Retired  Tobacco Use   Smoking status: Never   Smokeless tobacco: Never  Vaping Use   Vaping Use: Never used  Substance and Sexual Activity   Alcohol use: No   Drug use: No   Sexual activity: Not on file  Other Topics Concern   Not on file  Social History Narrative   Lives at home with her husband.   Right-handed.   Two 16-oz bottles of soda per day.    Social Determinants of Health   Financial Resource Strain: Not on file  Food Insecurity: Not on file  Transportation Needs: Not on file  Physical Activity: Not on file  Stress: Not on file  Social Connections: Not on file  Intimate Partner Violence: Not on file   PHYSICAL EXAM  Vitals:   10/14/21 1329  BP: 139/77  Pulse: 83  Weight: 171 lb (77.6 kg)  Height: '5\' 7"'  (1.702 m)   Body mass index is 26.78 kg/m.    10/14/2021    1:33 PM 02/10/2021    1:43 PM  Montreal Cognitive Assessment   Visuospatial/ Executive (0/5) 3 4  Naming (0/3) 2 3  Attention: Read list of digits (0/2) 2 1  Attention: Read list of letters (0/1) 1 1  Attention: Serial 7 subtraction starting at 100 (0/3) 2 3  Language: Repeat phrase (0/2) 1 1  Language : Fluency (0/1) 1 0  Abstraction (0/2) 2 2  Delayed Recall (0/5) 0 0  Orientation (0/6) 2 2  Total 16 17   Generalized: Well developed, in no acute distress, very cooperative, elderly female Neurological examination  Mentation: Alert, history is provided by patient and her husband. Follows all commands speech and language fluent Cranial nerve II-XII: Pupils were equal round reactive to light. Extraocular movements were full, visual field were full on confrontational test. Facial sensation and strength were normal.  Head turning and shoulder  shrug  were normal and symmetric. Motor: Good strength all extremities, no significant muscle weakness noted Sensory: Sensory testing is intact to soft touch on all 4 extremities. No evidence of extinction is noted.  Coordination: Cerebellar testing reveals good finger-nose-finger and heel-to-shin bilaterally. Some slowness with lower extremities, but no apraxia Gait and station: To push off from seated position, gait is wide-based, cautious Reflexes: Deep tendon reflexes are symmetric and normal bilaterally.   DIAGNOSTIC DATA (LABS, IMAGING, TESTING) - I reviewed patient records, labs, notes, testing and imaging myself where available.  Lab Results  Component Value Date   WBC 5.2 03/29/2015   HGB 11.9 (L) 03/29/2015   HCT 36.9 03/29/2015   MCV 85.4 03/29/2015   PLT 157 03/29/2015      Component Value Date/Time   NA 137 02/18/2017 1143   K 4.3 02/18/2017 1143   CL 103 02/18/2017 1143   CO2 27 02/18/2017 1143   GLUCOSE 78 02/18/2017 1143   BUN 17 02/18/2017 1143   CREATININE 0.93 02/18/2017 1143   CREATININE 1.47 (H) 10/13/2013 1546   CALCIUM 10.7 (H) 02/18/2017 1143   PROT 7.0 02/18/2017 1143   PROT 6.6 05/15/2013 1030   ALBUMIN 4.2 02/18/2017 1143   AST 19 02/18/2017 1143   ALT 13 02/18/2017 1143   ALKPHOS 105 02/18/2017 1143   BILITOT 0.3 02/18/2017 1143   GFRNONAA 43 (L) 03/29/2015 1258   GFRAA 50 (L) 03/29/2015 1258   No results found for: "CHOL", "HDL", "LDLCALC", "LDLDIRECT", "TRIG", "CHOLHDL" No results found for: "HGBA1C" No results found for: "VITAMINB12" Lab Results  Component Value Date  TSH 1.590 02/10/2021    Butler Denmark, AGNP-C, DNP 10/14/2021, 1:46 PM [facility] [address], [place], [place] [address] [phone]

## 2021-10-14 ENCOUNTER — Ambulatory Visit (INDEPENDENT_AMBULATORY_CARE_PROVIDER_SITE_OTHER): Payer: Medicare Other | Admitting: Neurology

## 2021-10-14 ENCOUNTER — Encounter: Payer: Self-pay | Admitting: Neurology

## 2021-10-14 VITALS — BP 139/77 | HR 83 | Ht 67.0 in | Wt 171.0 lb

## 2021-10-14 DIAGNOSIS — G43709 Chronic migraine without aura, not intractable, without status migrainosus: Secondary | ICD-10-CM | POA: Diagnosis not present

## 2021-10-14 DIAGNOSIS — F03B0 Unspecified dementia, moderate, without behavioral disturbance, psychotic disturbance, mood disturbance, and anxiety: Secondary | ICD-10-CM

## 2021-10-14 MED ORDER — DONEPEZIL HCL 10 MG PO TABS: 10.0000 mg | ORAL_TABLET | Freq: Every day | ORAL | 11 refills | Status: AC

## 2021-10-14 MED ORDER — UBRELVY 50 MG PO TABS: 50.0000 mg | ORAL_TABLET | ORAL | 11 refills | Status: AC | PRN

## 2021-10-14 MED ORDER — VERAPAMIL HCL ER 180 MG PO TBCR: 180.0000 mg | EXTENDED_RELEASE_TABLET | Freq: Every day | ORAL | 11 refills | Status: AC

## 2021-10-14 MED ORDER — MEMANTINE HCL 10 MG PO TABS: 10.0000 mg | ORAL_TABLET | Freq: Two times a day (BID) | ORAL | 4 refills | Status: AC

## 2021-10-14 NOTE — Patient Instructions (Signed)
Continue current medications Call for any worsening headaches Continue current medications  Meds ordered this encounter  Medications   verapamil (CALAN-SR) 180 MG CR tablet    Sig: Take 1 tablet (180 mg total) by mouth at bedtime.    Dispense:  30 tablet    Refill:  11   memantine (NAMENDA) 10 MG tablet    Sig: Take 1 tablet (10 mg total) by mouth 2 (two) times daily.    Dispense:  180 tablet    Refill:  4   donepezil (ARICEPT) 10 MG tablet    Sig: Take 1 tablet (10 mg total) by mouth at bedtime.    Dispense:  30 tablet    Refill:  11   Ubrogepant (UBRELVY) 50 MG TABS    Sig: Take 50 mg by mouth as needed (take 1 at onset of migraine, may repeat 2 hours after intial dose).    Dispense:  12 tablet    Refill:  11

## 2022-01-02 DEATH — deceased

## 2022-04-21 ENCOUNTER — Ambulatory Visit: Payer: Medicare Other | Admitting: Neurology
